# Patient Record
Sex: Male | Born: 1945 | Race: White | Hispanic: No | Marital: Married | Smoking: Never smoker
Health system: Southern US, Community
[De-identification: ages and names within clinical notes are randomized; demographics above are authoritative.]

## PROBLEM LIST (undated history)

## (undated) DIAGNOSIS — F0390 Unspecified dementia without behavioral disturbance: Secondary | ICD-10-CM

## (undated) DIAGNOSIS — I639 Cerebral infarction, unspecified: Secondary | ICD-10-CM

## (undated) DIAGNOSIS — I10 Essential (primary) hypertension: Secondary | ICD-10-CM

## (undated) DIAGNOSIS — G4730 Sleep apnea, unspecified: Secondary | ICD-10-CM

## (undated) DIAGNOSIS — R569 Unspecified convulsions: Secondary | ICD-10-CM

## (undated) HISTORY — PX: NOSE SURGERY: SHX723

---

## 2011-11-25 DIAGNOSIS — E785 Hyperlipidemia, unspecified: Secondary | ICD-10-CM | POA: Diagnosis not present

## 2011-11-25 DIAGNOSIS — Z79899 Other long term (current) drug therapy: Secondary | ICD-10-CM | POA: Diagnosis not present

## 2011-12-17 DIAGNOSIS — Z7901 Long term (current) use of anticoagulants: Secondary | ICD-10-CM | POA: Diagnosis not present

## 2011-12-17 DIAGNOSIS — I6789 Other cerebrovascular disease: Secondary | ICD-10-CM | POA: Diagnosis not present

## 2012-01-06 DIAGNOSIS — N200 Calculus of kidney: Secondary | ICD-10-CM | POA: Diagnosis not present

## 2012-01-06 DIAGNOSIS — N400 Enlarged prostate without lower urinary tract symptoms: Secondary | ICD-10-CM | POA: Diagnosis not present

## 2012-01-15 DIAGNOSIS — Z7901 Long term (current) use of anticoagulants: Secondary | ICD-10-CM | POA: Diagnosis not present

## 2012-01-15 DIAGNOSIS — I6789 Other cerebrovascular disease: Secondary | ICD-10-CM | POA: Diagnosis not present

## 2012-01-21 DIAGNOSIS — I10 Essential (primary) hypertension: Secondary | ICD-10-CM | POA: Diagnosis not present

## 2012-01-21 DIAGNOSIS — E785 Hyperlipidemia, unspecified: Secondary | ICD-10-CM | POA: Diagnosis not present

## 2012-03-31 DIAGNOSIS — Z7901 Long term (current) use of anticoagulants: Secondary | ICD-10-CM | POA: Diagnosis not present

## 2012-03-31 DIAGNOSIS — I6789 Other cerebrovascular disease: Secondary | ICD-10-CM | POA: Diagnosis not present

## 2012-04-28 DIAGNOSIS — E785 Hyperlipidemia, unspecified: Secondary | ICD-10-CM | POA: Diagnosis not present

## 2012-04-28 DIAGNOSIS — F09 Unspecified mental disorder due to known physiological condition: Secondary | ICD-10-CM | POA: Diagnosis not present

## 2012-04-28 DIAGNOSIS — D72819 Decreased white blood cell count, unspecified: Secondary | ICD-10-CM | POA: Diagnosis not present

## 2012-04-28 DIAGNOSIS — I10 Essential (primary) hypertension: Secondary | ICD-10-CM | POA: Diagnosis not present

## 2012-04-28 DIAGNOSIS — I6350 Cerebral infarction due to unspecified occlusion or stenosis of unspecified cerebral artery: Secondary | ICD-10-CM | POA: Diagnosis not present

## 2012-05-05 DIAGNOSIS — E785 Hyperlipidemia, unspecified: Secondary | ICD-10-CM | POA: Diagnosis not present

## 2012-05-05 DIAGNOSIS — I6990 Unspecified sequelae of unspecified cerebrovascular disease: Secondary | ICD-10-CM | POA: Diagnosis not present

## 2012-05-05 DIAGNOSIS — Z136 Encounter for screening for cardiovascular disorders: Secondary | ICD-10-CM | POA: Diagnosis not present

## 2012-05-05 DIAGNOSIS — I10 Essential (primary) hypertension: Secondary | ICD-10-CM | POA: Diagnosis not present

## 2012-05-05 DIAGNOSIS — R7301 Impaired fasting glucose: Secondary | ICD-10-CM | POA: Diagnosis not present

## 2012-05-05 DIAGNOSIS — Z0000 Encounter for general adult medical examination without abnormal findings: Secondary | ICD-10-CM | POA: Diagnosis not present

## 2012-05-26 DIAGNOSIS — I69919 Unspecified symptoms and signs involving cognitive functions following unspecified cerebrovascular disease: Secondary | ICD-10-CM | POA: Diagnosis not present

## 2012-05-27 DIAGNOSIS — I6350 Cerebral infarction due to unspecified occlusion or stenosis of unspecified cerebral artery: Secondary | ICD-10-CM | POA: Diagnosis not present

## 2012-05-27 DIAGNOSIS — Z7901 Long term (current) use of anticoagulants: Secondary | ICD-10-CM | POA: Diagnosis not present

## 2012-06-03 DIAGNOSIS — G4733 Obstructive sleep apnea (adult) (pediatric): Secondary | ICD-10-CM | POA: Diagnosis not present

## 2012-06-03 DIAGNOSIS — J387 Other diseases of larynx: Secondary | ICD-10-CM | POA: Diagnosis not present

## 2012-07-22 DIAGNOSIS — I6350 Cerebral infarction due to unspecified occlusion or stenosis of unspecified cerebral artery: Secondary | ICD-10-CM | POA: Diagnosis not present

## 2012-07-22 DIAGNOSIS — Z7901 Long term (current) use of anticoagulants: Secondary | ICD-10-CM | POA: Diagnosis not present

## 2012-07-28 DIAGNOSIS — F09 Unspecified mental disorder due to known physiological condition: Secondary | ICD-10-CM | POA: Diagnosis not present

## 2012-07-28 DIAGNOSIS — E559 Vitamin D deficiency, unspecified: Secondary | ICD-10-CM | POA: Diagnosis not present

## 2012-07-28 DIAGNOSIS — I10 Essential (primary) hypertension: Secondary | ICD-10-CM | POA: Diagnosis not present

## 2012-07-28 DIAGNOSIS — Z139 Encounter for screening, unspecified: Secondary | ICD-10-CM | POA: Diagnosis not present

## 2012-07-28 DIAGNOSIS — Z7901 Long term (current) use of anticoagulants: Secondary | ICD-10-CM | POA: Diagnosis not present

## 2012-07-28 DIAGNOSIS — I6350 Cerebral infarction due to unspecified occlusion or stenosis of unspecified cerebral artery: Secondary | ICD-10-CM | POA: Diagnosis not present

## 2012-07-28 DIAGNOSIS — Z79899 Other long term (current) drug therapy: Secondary | ICD-10-CM | POA: Diagnosis not present

## 2012-07-28 DIAGNOSIS — E538 Deficiency of other specified B group vitamins: Secondary | ICD-10-CM | POA: Diagnosis not present

## 2012-07-30 DIAGNOSIS — E785 Hyperlipidemia, unspecified: Secondary | ICD-10-CM | POA: Diagnosis not present

## 2012-07-30 DIAGNOSIS — I6990 Unspecified sequelae of unspecified cerebrovascular disease: Secondary | ICD-10-CM | POA: Diagnosis not present

## 2012-07-30 DIAGNOSIS — I10 Essential (primary) hypertension: Secondary | ICD-10-CM | POA: Diagnosis not present

## 2012-08-12 DIAGNOSIS — Z23 Encounter for immunization: Secondary | ICD-10-CM | POA: Diagnosis not present

## 2012-08-21 DIAGNOSIS — Z8673 Personal history of transient ischemic attack (TIA), and cerebral infarction without residual deficits: Secondary | ICD-10-CM | POA: Diagnosis not present

## 2012-08-21 DIAGNOSIS — I6350 Cerebral infarction due to unspecified occlusion or stenosis of unspecified cerebral artery: Secondary | ICD-10-CM | POA: Diagnosis not present

## 2012-08-21 DIAGNOSIS — F09 Unspecified mental disorder due to known physiological condition: Secondary | ICD-10-CM | POA: Diagnosis not present

## 2012-08-21 DIAGNOSIS — E237 Disorder of pituitary gland, unspecified: Secondary | ICD-10-CM | POA: Diagnosis not present

## 2012-08-23 DIAGNOSIS — S61209A Unspecified open wound of unspecified finger without damage to nail, initial encounter: Secondary | ICD-10-CM | POA: Diagnosis not present

## 2012-09-16 DIAGNOSIS — Z7901 Long term (current) use of anticoagulants: Secondary | ICD-10-CM | POA: Diagnosis not present

## 2012-09-16 DIAGNOSIS — I6350 Cerebral infarction due to unspecified occlusion or stenosis of unspecified cerebral artery: Secondary | ICD-10-CM | POA: Diagnosis not present

## 2012-10-06 DIAGNOSIS — H43819 Vitreous degeneration, unspecified eye: Secondary | ICD-10-CM | POA: Diagnosis not present

## 2012-10-06 DIAGNOSIS — D3130 Benign neoplasm of unspecified choroid: Secondary | ICD-10-CM | POA: Diagnosis not present

## 2012-10-06 DIAGNOSIS — H1045 Other chronic allergic conjunctivitis: Secondary | ICD-10-CM | POA: Diagnosis not present

## 2012-10-09 DIAGNOSIS — Z7901 Long term (current) use of anticoagulants: Secondary | ICD-10-CM | POA: Diagnosis not present

## 2012-10-09 DIAGNOSIS — I6350 Cerebral infarction due to unspecified occlusion or stenosis of unspecified cerebral artery: Secondary | ICD-10-CM | POA: Diagnosis not present

## 2012-11-13 DIAGNOSIS — R7301 Impaired fasting glucose: Secondary | ICD-10-CM | POA: Diagnosis not present

## 2012-11-13 DIAGNOSIS — E785 Hyperlipidemia, unspecified: Secondary | ICD-10-CM | POA: Diagnosis not present

## 2012-11-13 DIAGNOSIS — Z79899 Other long term (current) drug therapy: Secondary | ICD-10-CM | POA: Diagnosis not present

## 2012-11-18 DIAGNOSIS — Z7901 Long term (current) use of anticoagulants: Secondary | ICD-10-CM | POA: Diagnosis not present

## 2012-11-18 DIAGNOSIS — I6350 Cerebral infarction due to unspecified occlusion or stenosis of unspecified cerebral artery: Secondary | ICD-10-CM | POA: Diagnosis not present

## 2012-11-19 DIAGNOSIS — E785 Hyperlipidemia, unspecified: Secondary | ICD-10-CM | POA: Diagnosis not present

## 2012-11-19 DIAGNOSIS — I6990 Unspecified sequelae of unspecified cerebrovascular disease: Secondary | ICD-10-CM | POA: Diagnosis not present

## 2012-11-19 DIAGNOSIS — I10 Essential (primary) hypertension: Secondary | ICD-10-CM | POA: Diagnosis not present

## 2012-11-19 DIAGNOSIS — R7301 Impaired fasting glucose: Secondary | ICD-10-CM | POA: Diagnosis not present

## 2012-11-24 DIAGNOSIS — I10 Essential (primary) hypertension: Secondary | ICD-10-CM | POA: Diagnosis not present

## 2012-11-24 DIAGNOSIS — Q211 Atrial septal defect: Secondary | ICD-10-CM | POA: Diagnosis not present

## 2012-11-24 DIAGNOSIS — Z8673 Personal history of transient ischemic attack (TIA), and cerebral infarction without residual deficits: Secondary | ICD-10-CM | POA: Diagnosis not present

## 2012-11-24 DIAGNOSIS — I6350 Cerebral infarction due to unspecified occlusion or stenosis of unspecified cerebral artery: Secondary | ICD-10-CM | POA: Diagnosis not present

## 2012-11-24 DIAGNOSIS — Q2111 Secundum atrial septal defect: Secondary | ICD-10-CM | POA: Diagnosis not present

## 2012-11-24 DIAGNOSIS — F09 Unspecified mental disorder due to known physiological condition: Secondary | ICD-10-CM | POA: Diagnosis not present

## 2012-11-27 DIAGNOSIS — IMO0001 Reserved for inherently not codable concepts without codable children: Secondary | ICD-10-CM | POA: Diagnosis not present

## 2012-11-27 DIAGNOSIS — G3184 Mild cognitive impairment, so stated: Secondary | ICD-10-CM | POA: Diagnosis not present

## 2013-01-06 DIAGNOSIS — Z7901 Long term (current) use of anticoagulants: Secondary | ICD-10-CM | POA: Diagnosis not present

## 2013-01-06 DIAGNOSIS — I6350 Cerebral infarction due to unspecified occlusion or stenosis of unspecified cerebral artery: Secondary | ICD-10-CM | POA: Diagnosis not present

## 2013-01-07 DIAGNOSIS — N400 Enlarged prostate without lower urinary tract symptoms: Secondary | ICD-10-CM | POA: Diagnosis not present

## 2013-01-07 DIAGNOSIS — N200 Calculus of kidney: Secondary | ICD-10-CM | POA: Diagnosis not present

## 2013-01-07 DIAGNOSIS — R109 Unspecified abdominal pain: Secondary | ICD-10-CM | POA: Diagnosis not present

## 2013-01-07 DIAGNOSIS — R1084 Generalized abdominal pain: Secondary | ICD-10-CM | POA: Diagnosis not present

## 2013-02-16 DIAGNOSIS — R7301 Impaired fasting glucose: Secondary | ICD-10-CM | POA: Diagnosis not present

## 2013-02-17 DIAGNOSIS — I6350 Cerebral infarction due to unspecified occlusion or stenosis of unspecified cerebral artery: Secondary | ICD-10-CM | POA: Diagnosis not present

## 2013-02-17 DIAGNOSIS — Z7901 Long term (current) use of anticoagulants: Secondary | ICD-10-CM | POA: Diagnosis not present

## 2013-02-18 DIAGNOSIS — I10 Essential (primary) hypertension: Secondary | ICD-10-CM | POA: Diagnosis not present

## 2013-02-18 DIAGNOSIS — R7301 Impaired fasting glucose: Secondary | ICD-10-CM | POA: Diagnosis not present

## 2013-02-18 DIAGNOSIS — E785 Hyperlipidemia, unspecified: Secondary | ICD-10-CM | POA: Diagnosis not present

## 2013-03-31 DIAGNOSIS — I6350 Cerebral infarction due to unspecified occlusion or stenosis of unspecified cerebral artery: Secondary | ICD-10-CM | POA: Diagnosis not present

## 2013-03-31 DIAGNOSIS — Z7901 Long term (current) use of anticoagulants: Secondary | ICD-10-CM | POA: Diagnosis not present

## 2013-04-20 DIAGNOSIS — J387 Other diseases of larynx: Secondary | ICD-10-CM | POA: Diagnosis not present

## 2013-04-20 DIAGNOSIS — G4733 Obstructive sleep apnea (adult) (pediatric): Secondary | ICD-10-CM | POA: Diagnosis not present

## 2013-06-09 DIAGNOSIS — E785 Hyperlipidemia, unspecified: Secondary | ICD-10-CM | POA: Diagnosis not present

## 2013-06-09 DIAGNOSIS — R7301 Impaired fasting glucose: Secondary | ICD-10-CM | POA: Diagnosis not present

## 2013-06-09 DIAGNOSIS — I10 Essential (primary) hypertension: Secondary | ICD-10-CM | POA: Diagnosis not present

## 2013-06-09 DIAGNOSIS — Z125 Encounter for screening for malignant neoplasm of prostate: Secondary | ICD-10-CM | POA: Diagnosis not present

## 2013-06-09 DIAGNOSIS — Z79899 Other long term (current) drug therapy: Secondary | ICD-10-CM | POA: Diagnosis not present

## 2013-06-11 DIAGNOSIS — I6990 Unspecified sequelae of unspecified cerebrovascular disease: Secondary | ICD-10-CM | POA: Diagnosis not present

## 2013-06-11 DIAGNOSIS — Z136 Encounter for screening for cardiovascular disorders: Secondary | ICD-10-CM | POA: Diagnosis not present

## 2013-06-11 DIAGNOSIS — Z0000 Encounter for general adult medical examination without abnormal findings: Secondary | ICD-10-CM | POA: Diagnosis not present

## 2013-06-11 DIAGNOSIS — Z7901 Long term (current) use of anticoagulants: Secondary | ICD-10-CM | POA: Diagnosis not present

## 2013-06-11 DIAGNOSIS — I10 Essential (primary) hypertension: Secondary | ICD-10-CM | POA: Diagnosis not present

## 2013-06-11 DIAGNOSIS — E785 Hyperlipidemia, unspecified: Secondary | ICD-10-CM | POA: Diagnosis not present

## 2013-06-11 DIAGNOSIS — I6350 Cerebral infarction due to unspecified occlusion or stenosis of unspecified cerebral artery: Secondary | ICD-10-CM | POA: Diagnosis not present

## 2013-06-30 DIAGNOSIS — M722 Plantar fascial fibromatosis: Secondary | ICD-10-CM | POA: Diagnosis not present

## 2013-07-09 DIAGNOSIS — Z7901 Long term (current) use of anticoagulants: Secondary | ICD-10-CM | POA: Diagnosis not present

## 2013-07-09 DIAGNOSIS — I6350 Cerebral infarction due to unspecified occlusion or stenosis of unspecified cerebral artery: Secondary | ICD-10-CM | POA: Diagnosis not present

## 2013-07-09 DIAGNOSIS — M722 Plantar fascial fibromatosis: Secondary | ICD-10-CM | POA: Diagnosis not present

## 2013-08-23 DIAGNOSIS — I6350 Cerebral infarction due to unspecified occlusion or stenosis of unspecified cerebral artery: Secondary | ICD-10-CM | POA: Diagnosis not present

## 2013-08-23 DIAGNOSIS — Z7901 Long term (current) use of anticoagulants: Secondary | ICD-10-CM | POA: Diagnosis not present

## 2013-08-27 DIAGNOSIS — R972 Elevated prostate specific antigen [PSA]: Secondary | ICD-10-CM | POA: Diagnosis not present

## 2013-09-02 DIAGNOSIS — Z23 Encounter for immunization: Secondary | ICD-10-CM | POA: Diagnosis not present

## 2013-09-06 DIAGNOSIS — N200 Calculus of kidney: Secondary | ICD-10-CM | POA: Diagnosis not present

## 2013-09-06 DIAGNOSIS — N400 Enlarged prostate without lower urinary tract symptoms: Secondary | ICD-10-CM | POA: Diagnosis not present

## 2013-09-09 DIAGNOSIS — Z7901 Long term (current) use of anticoagulants: Secondary | ICD-10-CM | POA: Diagnosis not present

## 2013-09-09 DIAGNOSIS — Z79899 Other long term (current) drug therapy: Secondary | ICD-10-CM | POA: Diagnosis not present

## 2013-09-09 DIAGNOSIS — I10 Essential (primary) hypertension: Secondary | ICD-10-CM | POA: Diagnosis not present

## 2013-09-09 DIAGNOSIS — Z8673 Personal history of transient ischemic attack (TIA), and cerebral infarction without residual deficits: Secondary | ICD-10-CM | POA: Diagnosis not present

## 2013-09-09 DIAGNOSIS — G3184 Mild cognitive impairment, so stated: Secondary | ICD-10-CM | POA: Diagnosis not present

## 2013-09-09 DIAGNOSIS — E785 Hyperlipidemia, unspecified: Secondary | ICD-10-CM | POA: Diagnosis not present

## 2013-09-09 DIAGNOSIS — I69919 Unspecified symptoms and signs involving cognitive functions following unspecified cerebrovascular disease: Secondary | ICD-10-CM | POA: Diagnosis not present

## 2013-09-09 DIAGNOSIS — Q211 Atrial septal defect: Secondary | ICD-10-CM | POA: Diagnosis not present

## 2013-09-21 DIAGNOSIS — Z7901 Long term (current) use of anticoagulants: Secondary | ICD-10-CM | POA: Diagnosis not present

## 2013-09-21 DIAGNOSIS — I6350 Cerebral infarction due to unspecified occlusion or stenosis of unspecified cerebral artery: Secondary | ICD-10-CM | POA: Diagnosis not present

## 2013-10-06 DIAGNOSIS — R319 Hematuria, unspecified: Secondary | ICD-10-CM | POA: Diagnosis not present

## 2013-10-08 DIAGNOSIS — I498 Other specified cardiac arrhythmias: Secondary | ICD-10-CM | POA: Diagnosis not present

## 2013-10-08 DIAGNOSIS — Z8673 Personal history of transient ischemic attack (TIA), and cerebral infarction without residual deficits: Secondary | ICD-10-CM | POA: Diagnosis not present

## 2013-10-11 DIAGNOSIS — R319 Hematuria, unspecified: Secondary | ICD-10-CM | POA: Diagnosis not present

## 2013-10-27 DIAGNOSIS — H1045 Other chronic allergic conjunctivitis: Secondary | ICD-10-CM | POA: Diagnosis not present

## 2013-10-27 DIAGNOSIS — Z5181 Encounter for therapeutic drug level monitoring: Secondary | ICD-10-CM | POA: Diagnosis not present

## 2013-10-27 DIAGNOSIS — H43819 Vitreous degeneration, unspecified eye: Secondary | ICD-10-CM | POA: Diagnosis not present

## 2013-10-27 DIAGNOSIS — D3130 Benign neoplasm of unspecified choroid: Secondary | ICD-10-CM | POA: Diagnosis not present

## 2014-02-28 DIAGNOSIS — I10 Essential (primary) hypertension: Secondary | ICD-10-CM | POA: Diagnosis not present

## 2014-02-28 DIAGNOSIS — K2100 Gastro-esophageal reflux disease with esophagitis, without bleeding: Secondary | ICD-10-CM | POA: Diagnosis not present

## 2014-02-28 DIAGNOSIS — E785 Hyperlipidemia, unspecified: Secondary | ICD-10-CM | POA: Diagnosis not present

## 2014-02-28 DIAGNOSIS — I6990 Unspecified sequelae of unspecified cerebrovascular disease: Secondary | ICD-10-CM | POA: Diagnosis not present

## 2014-03-18 DIAGNOSIS — N401 Enlarged prostate with lower urinary tract symptoms: Secondary | ICD-10-CM | POA: Diagnosis not present

## 2014-03-18 DIAGNOSIS — N200 Calculus of kidney: Secondary | ICD-10-CM | POA: Diagnosis not present

## 2014-03-18 DIAGNOSIS — N139 Obstructive and reflux uropathy, unspecified: Secondary | ICD-10-CM | POA: Diagnosis not present

## 2014-03-25 DIAGNOSIS — E785 Hyperlipidemia, unspecified: Secondary | ICD-10-CM | POA: Diagnosis not present

## 2014-03-25 DIAGNOSIS — K219 Gastro-esophageal reflux disease without esophagitis: Secondary | ICD-10-CM | POA: Diagnosis not present

## 2014-03-25 DIAGNOSIS — I6340 Cerebral infarction due to embolism of unspecified cerebral artery: Secondary | ICD-10-CM | POA: Diagnosis not present

## 2014-03-25 DIAGNOSIS — I10 Essential (primary) hypertension: Secondary | ICD-10-CM | POA: Diagnosis not present

## 2014-04-21 ENCOUNTER — Ambulatory Visit: Payer: Medicare Other | Admitting: Neurology

## 2014-05-03 DIAGNOSIS — I69919 Unspecified symptoms and signs involving cognitive functions following unspecified cerebrovascular disease: Secondary | ICD-10-CM | POA: Diagnosis not present

## 2014-05-03 DIAGNOSIS — I10 Essential (primary) hypertension: Secondary | ICD-10-CM | POA: Diagnosis not present

## 2014-05-03 DIAGNOSIS — R259 Unspecified abnormal involuntary movements: Secondary | ICD-10-CM | POA: Diagnosis not present

## 2014-05-03 DIAGNOSIS — G9349 Other encephalopathy: Secondary | ICD-10-CM | POA: Diagnosis not present

## 2014-05-03 DIAGNOSIS — R413 Other amnesia: Secondary | ICD-10-CM | POA: Diagnosis not present

## 2014-05-03 DIAGNOSIS — F29 Unspecified psychosis not due to a substance or known physiological condition: Secondary | ICD-10-CM | POA: Diagnosis not present

## 2014-05-03 DIAGNOSIS — G3184 Mild cognitive impairment, so stated: Secondary | ICD-10-CM | POA: Diagnosis not present

## 2014-05-03 DIAGNOSIS — H5340 Unspecified visual field defects: Secondary | ICD-10-CM | POA: Diagnosis not present

## 2014-05-03 DIAGNOSIS — Z885 Allergy status to narcotic agent status: Secondary | ICD-10-CM | POA: Diagnosis not present

## 2014-05-03 DIAGNOSIS — Z8673 Personal history of transient ischemic attack (TIA), and cerebral infarction without residual deficits: Secondary | ICD-10-CM | POA: Diagnosis not present

## 2014-05-18 DIAGNOSIS — R350 Frequency of micturition: Secondary | ICD-10-CM | POA: Diagnosis not present

## 2014-06-07 DIAGNOSIS — R413 Other amnesia: Secondary | ICD-10-CM | POA: Diagnosis not present

## 2014-06-07 DIAGNOSIS — E063 Autoimmune thyroiditis: Secondary | ICD-10-CM | POA: Diagnosis not present

## 2014-06-07 DIAGNOSIS — G9349 Other encephalopathy: Secondary | ICD-10-CM | POA: Diagnosis not present

## 2014-06-07 DIAGNOSIS — I69919 Unspecified symptoms and signs involving cognitive functions following unspecified cerebrovascular disease: Secondary | ICD-10-CM | POA: Diagnosis not present

## 2014-07-13 DIAGNOSIS — J309 Allergic rhinitis, unspecified: Secondary | ICD-10-CM | POA: Diagnosis not present

## 2014-08-25 DIAGNOSIS — Z23 Encounter for immunization: Secondary | ICD-10-CM | POA: Diagnosis not present

## 2014-08-25 DIAGNOSIS — I10 Essential (primary) hypertension: Secondary | ICD-10-CM | POA: Diagnosis not present

## 2014-08-25 DIAGNOSIS — Z125 Encounter for screening for malignant neoplasm of prostate: Secondary | ICD-10-CM | POA: Diagnosis not present

## 2014-08-25 DIAGNOSIS — Z0000 Encounter for general adult medical examination without abnormal findings: Secondary | ICD-10-CM | POA: Diagnosis not present

## 2014-08-30 DIAGNOSIS — H5340 Unspecified visual field defects: Secondary | ICD-10-CM | POA: Diagnosis not present

## 2014-08-30 DIAGNOSIS — R4701 Aphasia: Secondary | ICD-10-CM | POA: Diagnosis not present

## 2014-08-30 DIAGNOSIS — F0391 Unspecified dementia with behavioral disturbance: Secondary | ICD-10-CM | POA: Diagnosis not present

## 2014-08-30 DIAGNOSIS — R413 Other amnesia: Secondary | ICD-10-CM | POA: Diagnosis not present

## 2014-08-30 DIAGNOSIS — I6931 Cognitive deficits following cerebral infarction: Secondary | ICD-10-CM | POA: Diagnosis not present

## 2014-09-01 DIAGNOSIS — K219 Gastro-esophageal reflux disease without esophagitis: Secondary | ICD-10-CM | POA: Diagnosis not present

## 2014-09-01 DIAGNOSIS — I6340 Cerebral infarction due to embolism of unspecified cerebral artery: Secondary | ICD-10-CM | POA: Diagnosis not present

## 2014-09-01 DIAGNOSIS — I10 Essential (primary) hypertension: Secondary | ICD-10-CM | POA: Diagnosis not present

## 2014-09-01 DIAGNOSIS — E785 Hyperlipidemia, unspecified: Secondary | ICD-10-CM | POA: Diagnosis not present

## 2014-09-13 DIAGNOSIS — L719 Rosacea, unspecified: Secondary | ICD-10-CM | POA: Diagnosis not present

## 2014-09-13 DIAGNOSIS — B079 Viral wart, unspecified: Secondary | ICD-10-CM | POA: Diagnosis not present

## 2014-09-27 DIAGNOSIS — G9389 Other specified disorders of brain: Secondary | ICD-10-CM | POA: Diagnosis not present

## 2014-09-27 DIAGNOSIS — J341 Cyst and mucocele of nose and nasal sinus: Secondary | ICD-10-CM | POA: Diagnosis not present

## 2014-09-27 DIAGNOSIS — R413 Other amnesia: Secondary | ICD-10-CM | POA: Diagnosis not present

## 2014-10-12 ENCOUNTER — Emergency Department (HOSPITAL_COMMUNITY)
Admission: EM | Admit: 2014-10-12 | Discharge: 2014-10-13 | Disposition: A | Discharge status: Home / Self Care | Payer: Medicare Other | Attending: Emergency Medicine | Admitting: Emergency Medicine

## 2014-10-12 ENCOUNTER — Encounter (HOSPITAL_COMMUNITY): Payer: Self-pay | Admitting: Emergency Medicine

## 2014-10-12 DIAGNOSIS — R410 Disorientation, unspecified: Secondary | ICD-10-CM | POA: Diagnosis not present

## 2014-10-12 DIAGNOSIS — Z8669 Personal history of other diseases of the nervous system and sense organs: Secondary | ICD-10-CM | POA: Diagnosis not present

## 2014-10-12 DIAGNOSIS — R4182 Altered mental status, unspecified: Secondary | ICD-10-CM | POA: Diagnosis not present

## 2014-10-12 DIAGNOSIS — F0391 Unspecified dementia with behavioral disturbance: Secondary | ICD-10-CM | POA: Insufficient documentation

## 2014-10-12 DIAGNOSIS — I10 Essential (primary) hypertension: Secondary | ICD-10-CM | POA: Insufficient documentation

## 2014-10-12 DIAGNOSIS — R40241 Glasgow coma scale score 13-15: Secondary | ICD-10-CM | POA: Diagnosis not present

## 2014-10-12 DIAGNOSIS — I69920 Aphasia following unspecified cerebrovascular disease: Secondary | ICD-10-CM | POA: Diagnosis not present

## 2014-10-12 HISTORY — DX: Cerebral infarction, unspecified: I63.9

## 2014-10-12 HISTORY — DX: Essential (primary) hypertension: I10

## 2014-10-12 HISTORY — DX: Sleep apnea, unspecified: G47.30

## 2014-10-12 HISTORY — DX: Unspecified dementia, unspecified severity, without behavioral disturbance, psychotic disturbance, mood disturbance, and anxiety: F03.90

## 2014-10-12 NOTE — ED Notes (Addendum)
Pt. arrived with EMS from home , family reported progressing intermittent confusion and disorientation for the past several days and again  today at church  , CBG = 126 by EMS at arrival , alert and oriented at arrival  / respirations unlabored. Speech clear with no facial asymmetry , equal grips / no arm drift or leg drift .

## 2014-10-13 ENCOUNTER — Emergency Department (HOSPITAL_COMMUNITY): Payer: Medicare Other

## 2014-10-13 ENCOUNTER — Encounter (HOSPITAL_COMMUNITY): Payer: Self-pay

## 2014-10-13 DIAGNOSIS — R410 Disorientation, unspecified: Secondary | ICD-10-CM | POA: Diagnosis not present

## 2014-10-13 DIAGNOSIS — F0391 Unspecified dementia with behavioral disturbance: Secondary | ICD-10-CM | POA: Diagnosis not present

## 2014-10-13 LAB — COMPREHENSIVE METABOLIC PANEL
ALK PHOS: 57 U/L (ref 39–117)
ALT: 16 U/L (ref 0–53)
AST: 22 U/L (ref 0–37)
Albumin: 3.4 g/dL — ABNORMAL LOW (ref 3.5–5.2)
Anion gap: 11 (ref 5–15)
BILIRUBIN TOTAL: 1.1 mg/dL (ref 0.3–1.2)
BUN: 14 mg/dL (ref 6–23)
CHLORIDE: 101 meq/L (ref 96–112)
CO2: 28 mEq/L (ref 19–32)
Calcium: 9.4 mg/dL (ref 8.4–10.5)
Creatinine, Ser: 1.02 mg/dL (ref 0.50–1.35)
GFR, EST AFRICAN AMERICAN: 85 mL/min — AB (ref >90)
GFR, EST NON AFRICAN AMERICAN: 73 mL/min — AB (ref >90)
GLUCOSE: 125 mg/dL — AB (ref 70–99)
POTASSIUM: 4.4 meq/L (ref 3.7–5.3)
SODIUM: 140 meq/L (ref 137–147)
Total Protein: 6.2 g/dL (ref 6.0–8.3)

## 2014-10-13 LAB — CBC WITH DIFFERENTIAL/PLATELET
Basophils Absolute: 0 10*3/uL (ref 0.0–0.1)
Basophils Relative: 0 % (ref 0–1)
Eosinophils Absolute: 0.2 10*3/uL (ref 0.0–0.7)
Eosinophils Relative: 4 % (ref 0–5)
HCT: 42.8 % (ref 39.0–52.0)
Hemoglobin: 14.3 g/dL (ref 13.0–17.0)
LYMPHS ABS: 1.3 10*3/uL (ref 0.7–4.0)
LYMPHS PCT: 29 % (ref 12–46)
MCH: 30.1 pg (ref 26.0–34.0)
MCHC: 33.4 g/dL (ref 30.0–36.0)
MCV: 90.1 fL (ref 78.0–100.0)
Monocytes Absolute: 0.6 10*3/uL (ref 0.1–1.0)
Monocytes Relative: 13 % — ABNORMAL HIGH (ref 3–12)
NEUTROS PCT: 54 % (ref 43–77)
Neutro Abs: 2.4 10*3/uL (ref 1.7–7.7)
PLATELETS: 196 10*3/uL (ref 150–400)
RBC: 4.75 MIL/uL (ref 4.22–5.81)
RDW: 12 % (ref 11.5–15.5)
WBC: 4.5 10*3/uL (ref 4.0–10.5)

## 2014-10-13 LAB — URINALYSIS, ROUTINE W REFLEX MICROSCOPIC
BILIRUBIN URINE: NEGATIVE
Glucose, UA: NEGATIVE mg/dL
HGB URINE DIPSTICK: NEGATIVE
KETONES UR: 15 mg/dL — AB
Leukocytes, UA: NEGATIVE
Nitrite: NEGATIVE
PROTEIN: NEGATIVE mg/dL
Specific Gravity, Urine: 1.025 (ref 1.005–1.030)
UROBILINOGEN UA: 0.2 mg/dL (ref 0.0–1.0)
pH: 5.5 (ref 5.0–8.0)

## 2014-10-13 LAB — ETHANOL: Alcohol, Ethyl (B): <11 mg/dL (ref 0–11)

## 2014-10-13 MED ORDER — CLOPIDOGREL BISULFATE 75 MG PO TABS: 75.0000 mg | ORAL_TABLET | Freq: Every day | ORAL | Status: DC

## 2014-10-13 MED ORDER — CLOPIDOGREL BISULFATE 75 MG PO TABS
75.0000 mg | ORAL_TABLET | Freq: Once | ORAL | Status: AC
  Administered 2014-10-13: 75 mg via ORAL
  Filled 2014-10-13: qty 1

## 2014-10-13 MED ORDER — SODIUM CHLORIDE 0.9 % IV BOLUS (SEPSIS)
500.0000 mL | Freq: Once | INTRAVENOUS | Status: AC
  Administered 2014-10-13: 500 mL via INTRAVENOUS

## 2014-10-13 NOTE — ED Provider Notes (Signed)
CSN: [id number]     Arrival date & time 10/12/14  2333 History   First MD Initiated Contact with Patient 10/12/14 2355     Chief Complaint  Patient presents with  . Altered Mental Status     (Consider location/radiation/quality/duration/timing/severity/associated sxs/prior Treatment) Patient is a 68 y.o. male presenting with altered mental status. The history is provided by the patient. No language interpreter was used.  Altered Mental Status Presenting symptoms: disorientation   Severity:  Moderate Most recent episode:  Today Associated symptoms comment:  History is provided by the son at bedside. The patient is unable to relay reliable history secondary to decreased focus and concentration, memory loss, difficulty speaking. Per his son, he has had progressive memory loss over the last several months. 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 He has a history of stroke (2005) with symptoms of aphasia, visual loss and disorientation with minimal symptoms of weakness.    Past Medical History  Diagnosis Date  . Hypertension   . Stroke   . Dementia   . Sleep apnea    Past Surgical History  Procedure Laterality Date  . Nose surgery     No family history on file. History  Substance Use Topics  . Smoking status: Never Smoker   . Smokeless tobacco: Not on file  . Alcohol Use: No    Review of Systems  Unable to perform ROS     Allergies  Review of patient's allergies indicates not on file.  Home Medications   Prior to Admission medications   Not on File   BP 104/65 mmHg  Pulse 68  Temp(Src) 97.9 F (36.6 C) (Oral)  Resp 14  Ht 5' 11.5" (1.816 m)  Wt 185 lb (83.915 kg)  BMI 25.45 kg/m2  SpO2 97% Physical Exam  Constitutional: He appears  well-developed and well-nourished.  HENT:  Head: Normocephalic.  Neck: Normal range of motion. Neck supple.  Cardiovascular: Normal rate and regular rhythm.   Pulmonary/Chest: Effort normal and breath sounds normal. He has no wheezes. He has no rales.  Abdominal: Soft. Bowel sounds are normal. There is no tenderness. There is no rebound and no guarding.  Musculoskeletal: Normal range of motion.  Neurological: He is alert. He has normal strength. No cranial nerve deficit or sensory deficit. Coordination normal. GCS eye subscore is 4. GCS verbal subscore is 5. GCS motor subscore is 6.  Reflex Scores:      Patellar reflexes are 1+ on the right side and 1+ on the left side. He is having difficulty saying what he would like to say, and will go off topic easily.   Skin: Skin is warm and dry. No rash noted.  Psychiatric: He has a normal mood and affect.    ED Course  Procedures (including critical care time) Labs Review Labs Reviewed  CBC WITH DIFFERENTIAL  COMPREHENSIVE METABOLIC PANEL    Imaging Review No results found.   EKG Interpretation None      MDM   Final diagnoses:  None    1. Altered mental status  Patient care assumed by Dr. Venora Maples, pending work up.     Dewaine Oats, PA-C 10/13/14 [place], MD 10/13/14 [phone]

## 2014-10-13 NOTE — ED Notes (Signed)
Paged neuro/[facility]

## 2014-10-13 NOTE — Discharge Instructions (Signed)

## 2014-10-17 DIAGNOSIS — L719 Rosacea, unspecified: Secondary | ICD-10-CM | POA: Diagnosis not present

## 2014-10-20 DIAGNOSIS — F0280 Dementia in other diseases classified elsewhere without behavioral disturbance: Secondary | ICD-10-CM | POA: Diagnosis not present

## 2014-10-20 DIAGNOSIS — G3101 Pick's disease: Secondary | ICD-10-CM | POA: Diagnosis not present

## 2014-10-20 DIAGNOSIS — F064 Anxiety disorder due to known physiological condition: Secondary | ICD-10-CM | POA: Diagnosis not present

## 2014-10-20 DIAGNOSIS — G3109 Other frontotemporal dementia: Secondary | ICD-10-CM | POA: Diagnosis not present

## 2014-10-21 DIAGNOSIS — I69320 Aphasia following cerebral infarction: Secondary | ICD-10-CM | POA: Diagnosis not present

## 2014-10-21 DIAGNOSIS — G3189 Other specified degenerative diseases of nervous system: Secondary | ICD-10-CM | POA: Diagnosis not present

## 2014-10-28 DIAGNOSIS — D3131 Benign neoplasm of right choroid: Secondary | ICD-10-CM | POA: Diagnosis not present

## 2014-12-05 DIAGNOSIS — L718 Other rosacea: Secondary | ICD-10-CM | POA: Diagnosis not present

## 2014-12-22 DIAGNOSIS — I69398 Other sequelae of cerebral infarction: Secondary | ICD-10-CM | POA: Diagnosis not present

## 2014-12-22 DIAGNOSIS — G3109 Other frontotemporal dementia: Secondary | ICD-10-CM | POA: Diagnosis not present

## 2014-12-22 DIAGNOSIS — R4701 Aphasia: Secondary | ICD-10-CM | POA: Diagnosis not present

## 2015-02-20 DIAGNOSIS — I10 Essential (primary) hypertension: Secondary | ICD-10-CM | POA: Diagnosis not present

## 2015-02-20 DIAGNOSIS — E785 Hyperlipidemia, unspecified: Secondary | ICD-10-CM | POA: Diagnosis not present

## 2015-03-10 DIAGNOSIS — E785 Hyperlipidemia, unspecified: Secondary | ICD-10-CM | POA: Diagnosis not present

## 2015-03-10 DIAGNOSIS — I10 Essential (primary) hypertension: Secondary | ICD-10-CM | POA: Diagnosis not present

## 2015-03-10 DIAGNOSIS — I6340 Cerebral infarction due to embolism of unspecified cerebral artery: Secondary | ICD-10-CM | POA: Diagnosis not present

## 2015-03-10 DIAGNOSIS — K219 Gastro-esophageal reflux disease without esophagitis: Secondary | ICD-10-CM | POA: Diagnosis not present

## 2015-03-30 DIAGNOSIS — G3101 Pick's disease: Secondary | ICD-10-CM | POA: Diagnosis not present

## 2015-03-30 DIAGNOSIS — I69398 Other sequelae of cerebral infarction: Secondary | ICD-10-CM | POA: Diagnosis not present

## 2015-03-30 DIAGNOSIS — G3109 Other frontotemporal dementia: Secondary | ICD-10-CM | POA: Diagnosis not present

## 2015-03-30 DIAGNOSIS — F064 Anxiety disorder due to known physiological condition: Secondary | ICD-10-CM | POA: Diagnosis not present

## 2015-03-30 DIAGNOSIS — F0280 Dementia in other diseases classified elsewhere without behavioral disturbance: Secondary | ICD-10-CM | POA: Diagnosis not present

## 2015-05-01 DIAGNOSIS — F4323 Adjustment disorder with mixed anxiety and depressed mood: Secondary | ICD-10-CM | POA: Diagnosis not present

## 2015-05-17 DIAGNOSIS — F329 Major depressive disorder, single episode, unspecified: Secondary | ICD-10-CM | POA: Diagnosis not present

## 2015-05-17 DIAGNOSIS — R4701 Aphasia: Secondary | ICD-10-CM | POA: Diagnosis not present

## 2015-05-17 DIAGNOSIS — F419 Anxiety disorder, unspecified: Secondary | ICD-10-CM | POA: Diagnosis not present

## 2015-05-17 DIAGNOSIS — G3109 Other frontotemporal dementia: Secondary | ICD-10-CM | POA: Diagnosis not present

## 2015-05-17 DIAGNOSIS — F064 Anxiety disorder due to known physiological condition: Secondary | ICD-10-CM | POA: Diagnosis not present

## 2015-08-03 DIAGNOSIS — G4709 Other insomnia: Secondary | ICD-10-CM | POA: Diagnosis not present

## 2015-08-03 DIAGNOSIS — I69398 Other sequelae of cerebral infarction: Secondary | ICD-10-CM | POA: Diagnosis not present

## 2015-08-03 DIAGNOSIS — F064 Anxiety disorder due to known physiological condition: Secondary | ICD-10-CM | POA: Diagnosis not present

## 2015-08-03 DIAGNOSIS — F0280 Dementia in other diseases classified elsewhere without behavioral disturbance: Secondary | ICD-10-CM | POA: Diagnosis not present

## 2015-08-03 DIAGNOSIS — G3109 Other frontotemporal dementia: Secondary | ICD-10-CM | POA: Diagnosis not present

## 2015-08-03 DIAGNOSIS — G3101 Pick's disease: Secondary | ICD-10-CM | POA: Diagnosis not present

## 2015-09-01 DIAGNOSIS — Z0000 Encounter for general adult medical examination without abnormal findings: Secondary | ICD-10-CM | POA: Diagnosis not present

## 2015-09-01 DIAGNOSIS — E785 Hyperlipidemia, unspecified: Secondary | ICD-10-CM | POA: Diagnosis not present

## 2015-09-01 DIAGNOSIS — Z13820 Encounter for screening for osteoporosis: Secondary | ICD-10-CM | POA: Diagnosis not present

## 2015-09-01 DIAGNOSIS — Z1389 Encounter for screening for other disorder: Secondary | ICD-10-CM | POA: Diagnosis not present

## 2015-09-01 DIAGNOSIS — Z23 Encounter for immunization: Secondary | ICD-10-CM | POA: Diagnosis not present

## 2015-09-01 DIAGNOSIS — R7309 Other abnormal glucose: Secondary | ICD-10-CM | POA: Diagnosis not present

## 2015-09-01 DIAGNOSIS — Z125 Encounter for screening for malignant neoplasm of prostate: Secondary | ICD-10-CM | POA: Diagnosis not present

## 2015-09-01 DIAGNOSIS — I10 Essential (primary) hypertension: Secondary | ICD-10-CM | POA: Diagnosis not present

## 2015-09-15 DIAGNOSIS — Z23 Encounter for immunization: Secondary | ICD-10-CM | POA: Diagnosis not present

## 2015-09-15 DIAGNOSIS — R7309 Other abnormal glucose: Secondary | ICD-10-CM | POA: Diagnosis not present

## 2015-09-15 DIAGNOSIS — E785 Hyperlipidemia, unspecified: Secondary | ICD-10-CM | POA: Diagnosis not present

## 2015-09-15 DIAGNOSIS — N182 Chronic kidney disease, stage 2 (mild): Secondary | ICD-10-CM | POA: Diagnosis not present

## 2015-09-15 DIAGNOSIS — K219 Gastro-esophageal reflux disease without esophagitis: Secondary | ICD-10-CM | POA: Diagnosis not present

## 2015-09-15 DIAGNOSIS — I129 Hypertensive chronic kidney disease with stage 1 through stage 4 chronic kidney disease, or unspecified chronic kidney disease: Secondary | ICD-10-CM | POA: Diagnosis not present

## 2015-10-16 DIAGNOSIS — N138 Other obstructive and reflux uropathy: Secondary | ICD-10-CM | POA: Diagnosis not present

## 2015-10-16 DIAGNOSIS — N200 Calculus of kidney: Secondary | ICD-10-CM | POA: Diagnosis not present

## 2015-10-16 DIAGNOSIS — N401 Enlarged prostate with lower urinary tract symptoms: Secondary | ICD-10-CM | POA: Diagnosis not present

## 2015-10-16 DIAGNOSIS — N3942 Incontinence without sensory awareness: Secondary | ICD-10-CM | POA: Diagnosis not present

## 2015-10-19 DIAGNOSIS — G3109 Other frontotemporal dementia: Secondary | ICD-10-CM | POA: Diagnosis not present

## 2015-10-19 DIAGNOSIS — G3101 Pick's disease: Secondary | ICD-10-CM | POA: Diagnosis not present

## 2015-10-19 DIAGNOSIS — Z7189 Other specified counseling: Secondary | ICD-10-CM | POA: Diagnosis not present

## 2015-10-19 DIAGNOSIS — G4709 Other insomnia: Secondary | ICD-10-CM | POA: Diagnosis not present

## 2015-10-19 DIAGNOSIS — F064 Anxiety disorder due to known physiological condition: Secondary | ICD-10-CM | POA: Diagnosis not present

## 2015-10-19 DIAGNOSIS — F0280 Dementia in other diseases classified elsewhere without behavioral disturbance: Secondary | ICD-10-CM | POA: Diagnosis not present

## 2015-10-19 DIAGNOSIS — R413 Other amnesia: Secondary | ICD-10-CM | POA: Diagnosis not present

## 2015-11-24 DIAGNOSIS — G3109 Other frontotemporal dementia: Secondary | ICD-10-CM | POA: Diagnosis not present

## 2015-11-24 DIAGNOSIS — N3942 Incontinence without sensory awareness: Secondary | ICD-10-CM | POA: Diagnosis not present

## 2015-11-24 DIAGNOSIS — R972 Elevated prostate specific antigen [PSA]: Secondary | ICD-10-CM | POA: Diagnosis not present

## 2015-11-24 DIAGNOSIS — Z0000 Encounter for general adult medical examination without abnormal findings: Secondary | ICD-10-CM | POA: Diagnosis not present

## 2015-12-05 DIAGNOSIS — Z23 Encounter for immunization: Secondary | ICD-10-CM | POA: Diagnosis not present

## 2016-02-07 DIAGNOSIS — E7800 Pure hypercholesterolemia, unspecified: Secondary | ICD-10-CM | POA: Diagnosis not present

## 2016-02-07 DIAGNOSIS — L719 Rosacea, unspecified: Secondary | ICD-10-CM | POA: Diagnosis not present

## 2016-02-07 DIAGNOSIS — G4700 Insomnia, unspecified: Secondary | ICD-10-CM | POA: Diagnosis not present

## 2016-02-07 DIAGNOSIS — I10 Essential (primary) hypertension: Secondary | ICD-10-CM | POA: Diagnosis not present

## 2016-02-07 DIAGNOSIS — G3109 Other frontotemporal dementia: Secondary | ICD-10-CM | POA: Diagnosis not present

## 2016-02-07 DIAGNOSIS — F325 Major depressive disorder, single episode, in full remission: Secondary | ICD-10-CM | POA: Diagnosis not present

## 2016-02-20 DIAGNOSIS — F0151 Vascular dementia with behavioral disturbance: Secondary | ICD-10-CM | POA: Diagnosis not present

## 2016-02-20 DIAGNOSIS — G3109 Other frontotemporal dementia: Secondary | ICD-10-CM | POA: Diagnosis not present

## 2016-02-20 DIAGNOSIS — R4701 Aphasia: Secondary | ICD-10-CM | POA: Diagnosis not present

## 2016-02-20 DIAGNOSIS — G309 Alzheimer's disease, unspecified: Secondary | ICD-10-CM | POA: Diagnosis not present

## 2016-02-20 DIAGNOSIS — I69398 Other sequelae of cerebral infarction: Secondary | ICD-10-CM | POA: Diagnosis not present

## 2016-02-20 DIAGNOSIS — G3101 Pick's disease: Secondary | ICD-10-CM | POA: Diagnosis not present

## 2016-02-20 DIAGNOSIS — F064 Anxiety disorder due to known physiological condition: Secondary | ICD-10-CM | POA: Diagnosis not present

## 2016-02-20 DIAGNOSIS — H539 Unspecified visual disturbance: Secondary | ICD-10-CM | POA: Diagnosis not present

## 2016-02-20 DIAGNOSIS — F0280 Dementia in other diseases classified elsewhere without behavioral disturbance: Secondary | ICD-10-CM | POA: Diagnosis not present

## 2016-03-19 DIAGNOSIS — M25473 Effusion, unspecified ankle: Secondary | ICD-10-CM | POA: Diagnosis not present

## 2016-03-19 DIAGNOSIS — I10 Essential (primary) hypertension: Secondary | ICD-10-CM | POA: Diagnosis not present

## 2016-03-19 DIAGNOSIS — R635 Abnormal weight gain: Secondary | ICD-10-CM | POA: Diagnosis not present

## 2016-05-15 DIAGNOSIS — R0781 Pleurodynia: Secondary | ICD-10-CM | POA: Diagnosis not present

## 2016-06-12 ENCOUNTER — Encounter (HOSPITAL_COMMUNITY): Payer: Self-pay | Admitting: Emergency Medicine

## 2016-06-12 ENCOUNTER — Other Ambulatory Visit: Payer: Self-pay

## 2016-06-12 ENCOUNTER — Emergency Department (HOSPITAL_COMMUNITY)
Admission: EM | Admit: 2016-06-12 | Discharge: 2016-06-12 | Disposition: A | Discharge status: Home / Self Care | Payer: Medicare Other | Attending: Emergency Medicine | Admitting: Emergency Medicine

## 2016-06-12 ENCOUNTER — Emergency Department (HOSPITAL_COMMUNITY): Payer: Medicare Other

## 2016-06-12 DIAGNOSIS — Z8673 Personal history of transient ischemic attack (TIA), and cerebral infarction without residual deficits: Secondary | ICD-10-CM | POA: Insufficient documentation

## 2016-06-12 DIAGNOSIS — F0390 Unspecified dementia without behavioral disturbance: Secondary | ICD-10-CM | POA: Diagnosis not present

## 2016-06-12 DIAGNOSIS — R569 Unspecified convulsions: Secondary | ICD-10-CM | POA: Insufficient documentation

## 2016-06-12 DIAGNOSIS — Z79899 Other long term (current) drug therapy: Secondary | ICD-10-CM | POA: Insufficient documentation

## 2016-06-12 DIAGNOSIS — I10 Essential (primary) hypertension: Secondary | ICD-10-CM | POA: Insufficient documentation

## 2016-06-12 DIAGNOSIS — G40909 Epilepsy, unspecified, not intractable, without status epilepticus: Secondary | ICD-10-CM | POA: Diagnosis not present

## 2016-06-12 LAB — COMPREHENSIVE METABOLIC PANEL
ALK PHOS: 75 U/L (ref 38–126)
ALT: 23 U/L (ref 17–63)
AST: 29 U/L (ref 15–41)
Albumin: 3.5 g/dL (ref 3.5–5.0)
Anion gap: 7 (ref 5–15)
BILIRUBIN TOTAL: 1.8 mg/dL — AB (ref 0.3–1.2)
BUN: 11 mg/dL (ref 6–20)
CALCIUM: 9.2 mg/dL (ref 8.9–10.3)
CHLORIDE: 108 mmol/L (ref 101–111)
CO2: 23 mmol/L (ref 22–32)
CREATININE: 1.09 mg/dL (ref 0.61–1.24)
Glucose, Bld: 157 mg/dL — ABNORMAL HIGH (ref 65–99)
Potassium: 4.1 mmol/L (ref 3.5–5.1)
Sodium: 138 mmol/L (ref 135–145)
Total Protein: 5.8 g/dL — ABNORMAL LOW (ref 6.5–8.1)

## 2016-06-12 LAB — CBC WITH DIFFERENTIAL/PLATELET
BASOS ABS: 0 10*3/uL (ref 0.0–0.1)
Basophils Relative: 0 %
EOS PCT: 2 %
Eosinophils Absolute: 0.1 10*3/uL (ref 0.0–0.7)
HEMATOCRIT: 41.1 % (ref 39.0–52.0)
HEMOGLOBIN: 13.8 g/dL (ref 13.0–17.0)
LYMPHS ABS: 0.5 10*3/uL — AB (ref 0.7–4.0)
LYMPHS PCT: 13 %
MCH: 29.1 pg (ref 26.0–34.0)
MCHC: 33.6 g/dL (ref 30.0–36.0)
MCV: 86.7 fL (ref 78.0–100.0)
Monocytes Absolute: 0.4 10*3/uL (ref 0.1–1.0)
Monocytes Relative: 9 %
NEUTROS ABS: 3 10*3/uL (ref 1.7–7.7)
Neutrophils Relative %: 76 %
PLATELETS: 178 10*3/uL (ref 150–400)
RBC: 4.74 MIL/uL (ref 4.22–5.81)
RDW: 12.9 % (ref 11.5–15.5)
WBC: 3.9 10*3/uL — AB (ref 4.0–10.5)

## 2016-06-12 LAB — CBG MONITORING, ED: GLUCOSE-CAPILLARY: 122 mg/dL — AB (ref 65–99)

## 2016-06-12 MED ORDER — PHENYTOIN SODIUM EXTENDED 300 MG PO CAPS: 300.0000 mg | ORAL_CAPSULE | Freq: Every day | ORAL | 1 refills | Status: DC

## 2016-06-12 MED ORDER — PHENYTOIN SODIUM EXTENDED 100 MG PO CAPS: 300.0000 mg | ORAL_CAPSULE | Freq: Two times a day (BID) | ORAL | 0 refills | Status: DC

## 2016-06-12 MED ORDER — PHENYTOIN 50 MG PO CHEW
300.0000 mg | CHEWABLE_TABLET | Freq: Once | ORAL | Status: AC
  Administered 2016-06-12: 300 mg via ORAL
  Filled 2016-06-12: qty 6

## 2016-06-12 NOTE — ED Notes (Signed)
[facility] contacted this RN to leave their number for contact when disposition for patient is decided upon. [phone]

## 2016-06-12 NOTE — ED Notes (Signed)
Patient's daughter in law at bedside, this RN advised family member that I could have Dr Maryan Rued come in and speak to her about patient's plan of care.

## 2016-06-12 NOTE — ED Notes (Signed)
ptar at bedside. Pt's vss. pts daughter in law at bedside to sign for discharge instructions

## 2016-06-12 NOTE — ED Notes (Signed)
MD at bedside. 

## 2016-06-12 NOTE — ED Notes (Signed)
[facility]; [employer], returned pt to room. Door left open.

## 2016-06-12 NOTE — ED Triage Notes (Signed)
Pt arrives via [facility] from [facility], ems reports staff went in to get patient dressed this am, pt was a/o at baseline, upon getting pt up from his chair, patient fell back into chair and had a tonic clonic seizure. Pt has no hx of seizures.ems reports pt was unresponsive upon their arrival. Pt remains somnolent with some twitching motions of legs and face observed.

## 2016-06-12 NOTE — ED Notes (Signed)
Patient transported to CT 

## 2016-06-12 NOTE — ED Notes (Signed)
All discharge paperwork given to ptar upon discharge.

## 2016-06-12 NOTE — ED Provider Notes (Signed)
[facility] DEPT Provider Note   CSN: [id number] Arrival date & time: 06/12/16  0807  First Provider Contact:  None       History   Chief Complaint Chief Complaint  Patient presents with  . Seizures    HPI Lucas Lee is a 70 y.o. male.  The history is provided by the EMS personnel and the nursing home. The history is limited by the absence of a caregiver.  Seizures   This is a new problem. The current episode started 1 to 2 hours ago. The problem has been resolved. There was 1 seizure. The most recent episode lasted 30 to 120 seconds. Associated symptoms include sleepiness. Characteristics include rhythmic jerking and loss of consciousness. The episode was witnessed. There was no sensation of an aura present. The seizures did not continue in the ED. The seizure(s) had no focality. Possible causes do not include recent illness. There has been no fever. There were no medications administered prior to arrival.    Past Medical History:  Diagnosis Date  . Dementia   . Hypertension   . Sleep apnea   . Stroke [facility])     There are no active problems to display for this patient.   Past Surgical History:  Procedure Laterality Date  . NOSE SURGERY         Home Medications    Prior to Admission medications   Medication Sig Start Date End Date Taking? Authorizing Provider  clopidogrel (PLAVIX) 75 MG tablet Take 1 tablet (75 mg total) by mouth daily. 10/13/14   Jola Schmidt, MD  doxycycline (DORYX) 100 MG EC tablet Take 100 mg by mouth daily.    Historical Provider, MD  galantamine (RAZADYNE) 8 MG tablet Take 8 mg by mouth 2 (two) times daily.    Historical Provider, MD  losartan (COZAAR) 50 MG tablet Take 50 mg by mouth daily.    Historical Provider, MD  Multiple Vitamin (MULTIVITAMIN WITH MINERALS) TABS tablet Take 1 tablet by mouth daily.    Historical Provider, MD  omeprazole (PRILOSEC) 40 MG capsule Take 40 mg by mouth daily.    Historical Provider, MD  POTASSIUM  CITRATE PO Take 1 tablet by mouth daily.    Historical Provider, MD  simvastatin (ZOCOR) 10 MG tablet Take 10 mg by mouth daily.    Historical Provider, MD  SIMVASTATIN PO Take 1 tablet by mouth daily.    Historical Provider, MD    Family History No family history on file.  Social History Social History  Substance Use Topics  . Smoking status: Never Smoker  . Smokeless tobacco: Not on file  . Alcohol use No     Allergies   Dilaudid [hydromorphone hcl]   Review of Systems Review of Systems  Unable to perform ROS: Dementia  Neurological: Positive for seizures and loss of consciousness.     Physical Exam Updated Vital Signs BP 113/64   Pulse 95   Temp 98.1 F (36.7 C)   Resp 16   SpO2 98%   Physical Exam  Constitutional: He appears well-developed and well-nourished. No distress.  HENT:  Head: Normocephalic and atraumatic.  Mouth/Throat: Oropharynx is clear and moist.  Eyes: Conjunctivae and EOM are normal. Pupils are equal, round, and reactive to light.  Neck: Normal range of motion. Neck supple.  Cardiovascular: Normal rate, regular rhythm and intact distal pulses.   No murmur heard. Pulmonary/Chest: Effort normal and breath sounds normal. No respiratory distress. He has no wheezes. He has no rales.  Abdominal: Soft. He exhibits no distension. There is no tenderness. There is no rebound and no guarding.  Musculoskeletal: Normal range of motion. He exhibits no edema or tenderness.  Neurological: He is alert. He has normal strength. He is disoriented. No cranial nerve deficit or sensory deficit. Coordination normal.  Will answer yes and no questions but not oriented to place or time  Skin: Skin is warm and dry. No rash noted. No erythema.  Psychiatric: He has a normal mood and affect. His behavior is normal.  Nursing note and vitals reviewed.    ED Treatments / Results  Labs (all labs ordered are listed, but only abnormal results are displayed) Labs Reviewed    CBC WITH DIFFERENTIAL/PLATELET - Abnormal; Notable for the following:       Result Value   WBC 3.9 (*)    Lymphs Abs 0.5 (*)    All other components within normal limits  COMPREHENSIVE METABOLIC PANEL - Abnormal; Notable for the following:    Glucose, Bld 157 (*)    Total Protein 5.8 (*)    Total Bilirubin 1.8 (*)    All other components within normal limits  CBG MONITORING, ED - Abnormal; Notable for the following:    Glucose-Capillary 122 (*)    All other components within normal limits    EKG  EKG Interpretation None      ED ECG REPORT   Date: 06/12/2016  Rate: 98  Rhythm: normal sinus rhythm  QRS Axis: normal  Intervals: normal  ST/T Wave abnormalities: normal  Conduction Disutrbances:none  Narrative Interpretation:   Old EKG Reviewed: none available  I have personally reviewed the EKG tracing and agree with the computerized printout as noted.  Radiology Ct Head Wo Contrast  Result Date: 06/12/2016 CLINICAL DATA:  First time seizure. EXAM: CT HEAD WITHOUT CONTRAST TECHNIQUE: Contiguous axial images were obtained from the base of the skull through the vertex without intravenous contrast. COMPARISON:  10/13/2014 FINDINGS: Brain: No evidence of acute infarction, hemorrhage, hydrocephalus, or mass lesion/mass effect. Remote left PCA territory infarct extending from the inferior temporal to occipital lobe. This involves extensive area of cortex and could be seizure focus. Bifrontal atrophy with ventriculomegaly, progressed from 2015. There is asymmetric involvement on the left with notable atrophy of the caudate nuclei. Patient has history of dementia. Vascular: No hyperdense vessel or unexpected calcification. Skull: Negative for fracture or focal lesion. Sinuses/Orbits: No acute finding. Other: None. IMPRESSION: 1. No acute finding. 2. Remote left PCA territory infarct with extensive cortical gliosis. 3. History of dementia with progressive frontal atrophy. Electronically  Signed   By: Monte Fantasia M.D.   On: 06/12/2016 09:19    Procedures Procedures (including critical care time)  Medications Ordered in ED Medications - No data to display   Initial Impression / Assessment and Plan / ED Course  I have reviewed the triage vital signs and the nursing notes.  Pertinent labs & imaging results that were available during my care of the patient were reviewed by me and considered in my medical decision making (see chart for details).  Clinical Course   Patient presenting today from the nursing home after having a witnessed seizure. He had tonic-clonic movement that lasted for less than 5 minutes and is now resolved. Because of patient's dementia and mental status he is unable to tell me any details. However he was pleasant, awake and cooperative. He has no focal weakness. Patient has had no prior history of seizures however has a history of  dementia lives in a memory care unitand prior history of stroke. Head CT,CBC, CMP pending. Will rule out space-occupying lesion, intracranial hemorrhage, hyponatremia as the cause of his symptoms. It could be that prior stroke and dementia are what caused his seizure today. Will discuss with neurology about starting dilantin.  Imaging was negative and labs within normal limits. Patient will start Dilantin 300 mg extended release at night Final Clinical Impressions(s) / ED Diagnoses   Final diagnoses:  Seizure ([place])    New Prescriptions New Prescriptions   PHENYTOIN (DILANTIN) 100 MG ER CAPSULE    Take 3 capsules (300 mg total) by mouth 2 (two) times daily. Need to take 300mg  at 1pm and then 400mg  at 3pm   PHENYTOIN (DILANTIN) 300 MG ER CAPSULE    Take 1 capsule (300 mg total) by mouth at bedtime.     Blanchie Dessert, MD 06/12/16 1056

## 2016-08-09 DIAGNOSIS — R569 Unspecified convulsions: Secondary | ICD-10-CM | POA: Diagnosis not present

## 2016-08-09 DIAGNOSIS — F325 Major depressive disorder, single episode, in full remission: Secondary | ICD-10-CM | POA: Diagnosis not present

## 2016-08-09 DIAGNOSIS — G3109 Other frontotemporal dementia: Secondary | ICD-10-CM | POA: Diagnosis not present

## 2016-08-09 DIAGNOSIS — Z1159 Encounter for screening for other viral diseases: Secondary | ICD-10-CM | POA: Diagnosis not present

## 2016-08-09 DIAGNOSIS — I10 Essential (primary) hypertension: Secondary | ICD-10-CM | POA: Diagnosis not present

## 2016-08-09 DIAGNOSIS — G4700 Insomnia, unspecified: Secondary | ICD-10-CM | POA: Diagnosis not present

## 2016-08-09 DIAGNOSIS — E7800 Pure hypercholesterolemia, unspecified: Secondary | ICD-10-CM | POA: Diagnosis not present

## 2016-08-16 DIAGNOSIS — Z23 Encounter for immunization: Secondary | ICD-10-CM | POA: Diagnosis not present

## 2017-02-11 ENCOUNTER — Emergency Department (HOSPITAL_COMMUNITY)
Admission: EM | Admit: 2017-02-11 | Discharge: 2017-02-11 | Disposition: A | Discharge status: Home / Self Care | Payer: Medicare Other | Attending: Emergency Medicine | Admitting: Emergency Medicine

## 2017-02-11 ENCOUNTER — Emergency Department (HOSPITAL_COMMUNITY): Payer: Medicare Other

## 2017-02-11 ENCOUNTER — Encounter (HOSPITAL_COMMUNITY): Payer: Self-pay

## 2017-02-11 DIAGNOSIS — G40909 Epilepsy, unspecified, not intractable, without status epilepticus: Secondary | ICD-10-CM | POA: Diagnosis not present

## 2017-02-11 DIAGNOSIS — I10 Essential (primary) hypertension: Secondary | ICD-10-CM | POA: Diagnosis not present

## 2017-02-11 DIAGNOSIS — R569 Unspecified convulsions: Secondary | ICD-10-CM

## 2017-02-11 DIAGNOSIS — Z8673 Personal history of transient ischemic attack (TIA), and cerebral infarction without residual deficits: Secondary | ICD-10-CM | POA: Insufficient documentation

## 2017-02-11 HISTORY — DX: Unspecified convulsions: R56.9

## 2017-02-11 LAB — URINALYSIS, ROUTINE W REFLEX MICROSCOPIC
Bilirubin Urine: NEGATIVE
GLUCOSE, UA: NEGATIVE mg/dL
Hgb urine dipstick: NEGATIVE
KETONES UR: NEGATIVE mg/dL
LEUKOCYTES UA: NEGATIVE
Nitrite: NEGATIVE
PH: 8 (ref 5.0–8.0)
Protein, ur: NEGATIVE mg/dL
SPECIFIC GRAVITY, URINE: 1.014 (ref 1.005–1.030)

## 2017-02-11 LAB — CBC WITH DIFFERENTIAL/PLATELET
Basophils Absolute: 0 10*3/uL (ref 0.0–0.1)
Basophils Relative: 0 %
EOS ABS: 0.1 10*3/uL (ref 0.0–0.7)
EOS PCT: 3 %
HCT: 40.1 % (ref 39.0–52.0)
HEMOGLOBIN: 13.5 g/dL (ref 13.0–17.0)
LYMPHS ABS: 0.8 10*3/uL (ref 0.7–4.0)
Lymphocytes Relative: 18 %
MCH: 29.2 pg (ref 26.0–34.0)
MCHC: 33.7 g/dL (ref 30.0–36.0)
MCV: 86.6 fL (ref 78.0–100.0)
MONOS PCT: 9 %
Monocytes Absolute: 0.4 10*3/uL (ref 0.1–1.0)
Neutro Abs: 3.1 10*3/uL (ref 1.7–7.7)
Neutrophils Relative %: 70 %
Platelets: 189 10*3/uL (ref 150–400)
RBC: 4.63 MIL/uL (ref 4.22–5.81)
RDW: 13.2 % (ref 11.5–15.5)
WBC: 4.4 10*3/uL (ref 4.0–10.5)

## 2017-02-11 LAB — COMPREHENSIVE METABOLIC PANEL
ALK PHOS: 66 U/L (ref 38–126)
ALT: 18 U/L (ref 17–63)
ANION GAP: 12 (ref 5–15)
AST: 30 U/L (ref 15–41)
Albumin: 3.5 g/dL (ref 3.5–5.0)
BILIRUBIN TOTAL: 1.3 mg/dL — AB (ref 0.3–1.2)
BUN: 17 mg/dL (ref 6–20)
CHLORIDE: 104 mmol/L (ref 101–111)
CO2: 22 mmol/L (ref 22–32)
CREATININE: 1.1 mg/dL (ref 0.61–1.24)
Calcium: 9.2 mg/dL (ref 8.9–10.3)
Glucose, Bld: 141 mg/dL — ABNORMAL HIGH (ref 65–99)
Potassium: 4 mmol/L (ref 3.5–5.1)
Sodium: 138 mmol/L (ref 135–145)
Total Protein: 6 g/dL — ABNORMAL LOW (ref 6.5–8.1)

## 2017-02-11 LAB — CBG MONITORING, ED: Glucose-Capillary: 135 mg/dL — ABNORMAL HIGH (ref 65–99)

## 2017-02-11 LAB — PHENYTOIN LEVEL, TOTAL

## 2017-02-11 MED ORDER — SODIUM CHLORIDE 0.9 % IV SOLN
10.0000 mg/kg | Freq: Once | INTRAVENOUS | Status: AC
  Administered 2017-02-11: 900 mg via INTRAVENOUS
  Filled 2017-02-11: qty 18

## 2017-02-11 MED ORDER — PHENYTOIN SODIUM EXTENDED 300 MG PO CAPS: 300.0000 mg | ORAL_CAPSULE | Freq: Every day | ORAL | 0 refills | Status: DC

## 2017-02-11 NOTE — ED Notes (Signed)
Pt had an incontinent void. Pt changed and repositioned in bed.

## 2017-02-11 NOTE — ED Notes (Signed)
Patient transported to CT 

## 2017-02-11 NOTE — ED Provider Notes (Signed)
[facility] DEPT Provider Note   CSN: [id number] Arrival date & time: 02/11/17  1403     History   Chief Complaint Chief Complaint  Patient presents with  . Seizures    HPI Lucas Lee is a 71 y.o. male.  HPI   71 year old male with history of vascular dementia and single seizure in the past who presents with seizure episode. Patient reportedly was in his usual state of health yesterday. His wife states that he was walking with her for 45 minutes yesterday and seemed to be at his baseline. She was notified today that the patient was reportedly walking back to his room from the restroom when he began having generalized seizure-like activity. This lasted several minutes. On her arrival, he was minimally responsive but had no further shaking episodes. He was not incontinent of bowel or bladder but had just gone to the bathroom. No falls. He was supported by staff throughout the episode. Currently, patient is reportedly at his baseline. He does have a chronic cough that seems to be mildly worsened. Family denies any recent medication changes. Denies any recent falls.  Level 5 caveat invoked as remainder of history, ROS, and physical exam limited due to patient's dementia.   Past Medical History:  Diagnosis Date  . Dementia   . Hypertension   . Seizures ([place])   . Sleep apnea   . Stroke [facility])     There are no active problems to display for this patient.   Past Surgical History:  Procedure Laterality Date  . NOSE SURGERY         Home Medications    Prior to Admission medications   Medication Sig Start Date End Date Taking? Authorizing Provider  aspirin 81 MG tablet Take 81 mg by mouth daily.   Yes Historical Provider, MD  doxycycline (DORYX) 100 MG EC tablet Take 100 mg by mouth 2 (two) times daily.    Yes Historical Provider, MD  losartan (COZAAR) 50 MG tablet Take 50 mg by mouth daily.   Yes Historical Provider, MD  sertraline (ZOLOFT) 50 MG tablet Take 50 mg by  mouth daily.   Yes Historical Provider, MD  simvastatin (ZOCOR) 10 MG tablet Take 10 mg by mouth daily at 6 PM.    Yes Historical Provider, MD  traZODone (DESYREL) 50 MG tablet Take 50 mg by mouth at bedtime.   Yes Historical Provider, MD  phenytoin (DILANTIN) 300 MG ER capsule Take 1 capsule (300 mg total) by mouth at bedtime. 02/11/17 03/13/17  Duffy Bruce, MD    Family History History reviewed. No pertinent family history.  Social History Social History  Substance Use Topics  . Smoking status: Never Smoker  . Smokeless tobacco: Never Used  . Alcohol use No     Allergies   Hydromorphone   Review of Systems Review of Systems  Unable to perform ROS: Dementia     Physical Exam Updated Vital Signs BP (!) 143/72   Pulse 69   Temp 97.1 F (36.2 C) (Axillary)   Resp 13   Wt 198 lb 6.6 oz (90 kg)   SpO2 99%   BMI 27.29 kg/m   Physical Exam  Constitutional: He appears well-developed and well-nourished. No distress.  HENT:  Head: Normocephalic and atraumatic.  Small contusion to lateral aspect of tongue. No active bleeding.  Eyes: Conjunctivae are normal.  Neck: Neck supple.  Cardiovascular: Normal rate, regular rhythm and normal heart sounds.  Exam reveals no friction rub.   No  murmur heard. Pulmonary/Chest: Effort normal and breath sounds normal. No respiratory distress. He has no wheezes. He has no rales.  Abdominal: He exhibits no distension.  Musculoskeletal: He exhibits no edema.  Neurological: He is alert. He exhibits normal muscle tone.  Minimally verbal. Face symmetric. MAE with 5/5 strength. Follows commands. Tongue is midline. Endorses intact sensation to light touch b/l UE and LE.  Skin: Skin is warm. Capillary refill takes less than 2 seconds.  Psychiatric: He has a normal mood and affect.  Nursing note and vitals reviewed.    ED Treatments / Results  Labs (all labs ordered are listed, but only abnormal results are displayed) Labs Reviewed    COMPREHENSIVE METABOLIC PANEL - Abnormal; Notable for the following:       Result Value   Glucose, Bld 141 (*)    Total Protein 6.0 (*)    Total Bilirubin 1.3 (*)    All other components within normal limits  PHENYTOIN LEVEL, TOTAL - Abnormal; Notable for the following:    Phenytoin Lvl <2.5 (*)    All other components within normal limits  URINALYSIS, ROUTINE W REFLEX MICROSCOPIC - Abnormal; Notable for the following:    APPearance CLOUDY (*)    All other components within normal limits  CBG MONITORING, ED - Abnormal; Notable for the following:    Glucose-Capillary 135 (*)    All other components within normal limits  CBC WITH DIFFERENTIAL/PLATELET    EKG  EKG Interpretation  Date/Time:  Tuesday February 11 2017 14:11:58 EDT Ventricular Rate:  93 PR Interval:    QRS Duration: 84 QT Interval:  373 QTC Calculation: 464 R Axis:   -5 Text Interpretation:  Sinus rhythm Prolonged PR interval no singificant change since Aug 2017 Confirmed by Regenia Skeeter MD, SCOTT [phone]) on 02/11/2017 2:19:14 PM       Radiology Dg Chest 2 View  Result Date: 02/11/2017 CLINICAL DATA:  Seizures EXAM: CHEST  2 VIEW COMPARISON:  None. FINDINGS: There is no edema or consolidation. Heart size and pulmonary vascularity are normal. No adenopathy. Aorta is mildly prominent. No pneumothorax. No bone lesions. IMPRESSION: Aorta mildly prominent. This finding may be indicative of chronic hypertension. No edema or consolidation. Electronically Signed   By: Lowella Grip III M.D.   On: 02/11/2017 16:03   Ct Head Wo Contrast  Result Date: 02/11/2017 CLINICAL DATA:  Seizure today. Previous history of seizures and strokes. EXAM: CT HEAD WITHOUT CONTRAST TECHNIQUE: Contiguous axial images were obtained from the base of the skull through the vertex without intravenous contrast. COMPARISON:  06/12/2016 FINDINGS: Brain: The brain shows generalized atrophy with chronic small-vessel ischemic changes affecting the white matter,  frontal predominant. Ex vacuo enlargement of the frontal horns of lateral ventricles. Chronic small-vessel ischemic changes affect the thalami. There is old infarction in the left PCA territory affecting the posteromedial temporal lobe and occipital lobe. No acute finding. No hemorrhage, mass or extra-axial collection. Vascular: There is atherosclerotic calcification of the major vessels at the base of the brain. Skull: Negative Sinuses/Orbits: Mild mucosal sinus inflammation.  Orbits negative. Other: None IMPRESSION: No acute finding by CT. Atrophy and chronic small-vessel ischemic changes. Old left PCA territory infarction. Electronically Signed   By: Nelson Chimes M.D.   On: 02/11/2017 16:27    Procedures Procedures (including critical care time)  Medications Ordered in ED Medications  phenytoin (DILANTIN) 900 mg in sodium chloride 0.9 % 250 mL IVPB (0 mg/kg  90 kg Intravenous Stopped 02/11/17 2021)  Initial Impression / Assessment and Plan / ED Course  I have reviewed the triage vital signs and the nursing notes.  Pertinent labs & imaging results that were available during my care of the patient were reviewed by me and considered in my medical decision making (see chart for details).    71 year old male with history of vascular dementia here with witnessed seizure. On exam, patient is back to his neurological baseline. He has mild tongue trauma consistent with seizure. Screening lab work is unremarkable. He has no apparent trigger for his seizure including no evidence of pneumonia, likely abnormality, or infection. Urinalysis is pending. CT head is negative. I suspect this is secondary to his underlying organic brain disease. Will consult neurology for evaluation and recommendations.  UA without UTI. I discussed with Neurology - will load with Dilantin here, d/c with outpt Dilantin 300 qHS. Family is comfortable with this plan. Will d/c home. He is ambulatory without difficulty, no other  acute issues.   Final Clinical Impressions(s) / ED Diagnoses   Final diagnoses:  Seizure [facility])    New Prescriptions Discharge Medication List as of 02/11/2017  7:21 PM       Duffy Bruce, MD 02/11/17 2345

## 2017-02-11 NOTE — ED Triage Notes (Signed)
Pt from [facility] with possible seizure. Pt was in room and called out and fell. Pt had hands shaking and more confused.

## 2017-02-13 DIAGNOSIS — I10 Essential (primary) hypertension: Secondary | ICD-10-CM | POA: Diagnosis not present

## 2017-02-13 DIAGNOSIS — E7800 Pure hypercholesterolemia, unspecified: Secondary | ICD-10-CM | POA: Diagnosis not present

## 2017-02-25 DIAGNOSIS — G4700 Insomnia, unspecified: Secondary | ICD-10-CM | POA: Diagnosis not present

## 2017-02-25 DIAGNOSIS — L719 Rosacea, unspecified: Secondary | ICD-10-CM | POA: Diagnosis not present

## 2017-02-25 DIAGNOSIS — G40909 Epilepsy, unspecified, not intractable, without status epilepticus: Secondary | ICD-10-CM | POA: Diagnosis not present

## 2017-02-25 DIAGNOSIS — F325 Major depressive disorder, single episode, in full remission: Secondary | ICD-10-CM | POA: Diagnosis not present

## 2017-02-25 DIAGNOSIS — M8580 Other specified disorders of bone density and structure, unspecified site: Secondary | ICD-10-CM | POA: Diagnosis not present

## 2017-02-25 DIAGNOSIS — Z0000 Encounter for general adult medical examination without abnormal findings: Secondary | ICD-10-CM | POA: Diagnosis not present

## 2017-02-25 DIAGNOSIS — E7800 Pure hypercholesterolemia, unspecified: Secondary | ICD-10-CM | POA: Diagnosis not present

## 2017-02-25 DIAGNOSIS — I10 Essential (primary) hypertension: Secondary | ICD-10-CM | POA: Diagnosis not present

## 2017-02-25 DIAGNOSIS — Z23 Encounter for immunization: Secondary | ICD-10-CM | POA: Diagnosis not present

## 2017-02-25 DIAGNOSIS — G3109 Other frontotemporal dementia: Secondary | ICD-10-CM | POA: Diagnosis not present

## 2017-03-21 DIAGNOSIS — Z79899 Other long term (current) drug therapy: Secondary | ICD-10-CM | POA: Diagnosis not present

## 2017-03-21 DIAGNOSIS — Z8669 Personal history of other diseases of the nervous system and sense organs: Secondary | ICD-10-CM | POA: Diagnosis not present

## 2017-04-03 DIAGNOSIS — M25472 Effusion, left ankle: Secondary | ICD-10-CM | POA: Diagnosis not present

## 2017-05-02 DIAGNOSIS — R600 Localized edema: Secondary | ICD-10-CM | POA: Diagnosis not present

## 2017-05-07 DIAGNOSIS — R2681 Unsteadiness on feet: Secondary | ICD-10-CM | POA: Diagnosis not present

## 2017-05-12 DIAGNOSIS — R2681 Unsteadiness on feet: Secondary | ICD-10-CM | POA: Diagnosis not present

## 2017-05-16 DIAGNOSIS — R2681 Unsteadiness on feet: Secondary | ICD-10-CM | POA: Diagnosis not present

## 2017-05-20 DIAGNOSIS — R2681 Unsteadiness on feet: Secondary | ICD-10-CM | POA: Diagnosis not present

## 2017-05-21 DIAGNOSIS — R2681 Unsteadiness on feet: Secondary | ICD-10-CM | POA: Diagnosis not present

## 2017-05-23 DIAGNOSIS — R2681 Unsteadiness on feet: Secondary | ICD-10-CM | POA: Diagnosis not present

## 2017-05-27 DIAGNOSIS — R2681 Unsteadiness on feet: Secondary | ICD-10-CM | POA: Diagnosis not present

## 2017-05-28 DIAGNOSIS — R2681 Unsteadiness on feet: Secondary | ICD-10-CM | POA: Diagnosis not present

## 2017-05-30 DIAGNOSIS — R2681 Unsteadiness on feet: Secondary | ICD-10-CM | POA: Diagnosis not present

## 2017-06-01 ENCOUNTER — Encounter (HOSPITAL_COMMUNITY): Payer: Self-pay

## 2017-06-01 ENCOUNTER — Emergency Department (HOSPITAL_COMMUNITY): Payer: Medicare Other

## 2017-06-01 ENCOUNTER — Emergency Department (HOSPITAL_COMMUNITY)
Admission: EM | Admit: 2017-06-01 | Discharge: 2017-06-01 | Disposition: A | Discharge status: Home / Self Care | Payer: Medicare Other | Attending: Emergency Medicine | Admitting: Emergency Medicine

## 2017-06-01 DIAGNOSIS — R404 Transient alteration of awareness: Secondary | ICD-10-CM | POA: Diagnosis not present

## 2017-06-01 DIAGNOSIS — R7889 Finding of other specified substances, not normally found in blood: Secondary | ICD-10-CM

## 2017-06-01 DIAGNOSIS — R892 Abnormal level of other drugs, medicaments and biological substances in specimens from other organs, systems and tissues: Secondary | ICD-10-CM | POA: Diagnosis not present

## 2017-06-01 DIAGNOSIS — R4701 Aphasia: Secondary | ICD-10-CM

## 2017-06-01 DIAGNOSIS — Z79899 Other long term (current) drug therapy: Secondary | ICD-10-CM | POA: Diagnosis not present

## 2017-06-01 DIAGNOSIS — I639 Cerebral infarction, unspecified: Secondary | ICD-10-CM

## 2017-06-01 DIAGNOSIS — R29818 Other symptoms and signs involving the nervous system: Secondary | ICD-10-CM | POA: Diagnosis not present

## 2017-06-01 DIAGNOSIS — R531 Weakness: Secondary | ICD-10-CM | POA: Diagnosis not present

## 2017-06-01 DIAGNOSIS — I10 Essential (primary) hypertension: Secondary | ICD-10-CM | POA: Diagnosis not present

## 2017-06-01 DIAGNOSIS — G464 Cerebellar stroke syndrome: Secondary | ICD-10-CM | POA: Diagnosis not present

## 2017-06-01 LAB — I-STAT CHEM 8, ED
BUN: 20 mg/dL (ref 6–20)
CHLORIDE: 105 mmol/L (ref 101–111)
Calcium, Ion: 1.16 mmol/L (ref 1.15–1.40)
Creatinine, Ser: 1 mg/dL (ref 0.61–1.24)
Glucose, Bld: 150 mg/dL — ABNORMAL HIGH (ref 65–99)
HCT: 44 % (ref 39.0–52.0)
HEMOGLOBIN: 15 g/dL (ref 13.0–17.0)
POTASSIUM: 4.1 mmol/L (ref 3.5–5.1)
SODIUM: 139 mmol/L (ref 135–145)
TCO2: 21 mmol/L (ref 0–100)

## 2017-06-01 LAB — COMPREHENSIVE METABOLIC PANEL
ALBUMIN: 4.2 g/dL (ref 3.5–5.0)
ALT: 22 U/L (ref 17–63)
ANION GAP: 9 (ref 5–15)
AST: 25 U/L (ref 15–41)
Alkaline Phosphatase: 102 U/L (ref 38–126)
BUN: 18 mg/dL (ref 6–20)
CHLORIDE: 106 mmol/L (ref 101–111)
CO2: 22 mmol/L (ref 22–32)
Calcium: 9.6 mg/dL (ref 8.9–10.3)
Creatinine, Ser: 1.1 mg/dL (ref 0.61–1.24)
GFR calc Af Amer: >60 mL/min (ref >60)
GFR calc non Af Amer: >60 mL/min (ref >60)
GLUCOSE: 149 mg/dL — AB (ref 65–99)
POTASSIUM: 4.1 mmol/L (ref 3.5–5.1)
SODIUM: 137 mmol/L (ref 135–145)
Total Bilirubin: 0.7 mg/dL (ref 0.3–1.2)
Total Protein: 6.7 g/dL (ref 6.5–8.1)

## 2017-06-01 LAB — CBC
HCT: 43.2 % (ref 39.0–52.0)
Hemoglobin: 14.6 g/dL (ref 13.0–17.0)
MCH: 29.1 pg (ref 26.0–34.0)
MCHC: 33.8 g/dL (ref 30.0–36.0)
MCV: 86.1 fL (ref 78.0–100.0)
PLATELETS: 239 10*3/uL (ref 150–400)
RBC: 5.02 MIL/uL (ref 4.22–5.81)
RDW: 13.1 % (ref 11.5–15.5)
WBC: 6.4 10*3/uL (ref 4.0–10.5)

## 2017-06-01 LAB — PROTIME-INR
INR: 1.05
PROTHROMBIN TIME: 13.7 s (ref 11.4–15.2)

## 2017-06-01 LAB — DIFFERENTIAL
BASOS PCT: 0 %
Basophils Absolute: 0 10*3/uL (ref 0.0–0.1)
EOS ABS: 0.1 10*3/uL (ref 0.0–0.7)
EOS PCT: 1 %
Lymphocytes Relative: 13 %
Lymphs Abs: 0.8 10*3/uL (ref 0.7–4.0)
Monocytes Absolute: 0.7 10*3/uL (ref 0.1–1.0)
Monocytes Relative: 11 %
Neutro Abs: 4.8 10*3/uL (ref 1.7–7.7)
Neutrophils Relative %: 75 %

## 2017-06-01 LAB — PHENYTOIN LEVEL, TOTAL: PHENYTOIN LVL: 5.8 ug/mL — AB (ref 10.0–20.0)

## 2017-06-01 LAB — I-STAT TROPONIN, ED: Troponin i, poc: 0 ng/mL (ref 0.00–0.08)

## 2017-06-01 LAB — CBG MONITORING, ED: GLUCOSE-CAPILLARY: 123 mg/dL — AB (ref 65–99)

## 2017-06-01 LAB — APTT: aPTT: 32 seconds (ref 24–36)

## 2017-06-01 MED ORDER — PHENYTOIN SODIUM EXTENDED 200 MG PO CAPS: 400.0000 mg | ORAL_CAPSULE | Freq: Every day | ORAL | 0 refills | Status: AC

## 2017-06-01 MED ORDER — PHENYTOIN 50 MG PO CHEW
150.0000 mg | CHEWABLE_TABLET | Freq: Once | ORAL | Status: AC
  Administered 2017-06-01: 150 mg via ORAL
  Filled 2017-06-01: qty 3

## 2017-06-01 NOTE — ED Notes (Signed)
Patient transported to MRI 

## 2017-06-01 NOTE — Discharge Instructions (Signed)
Please contact your neurology team at [facility] for additional recommendations

## 2017-06-01 NOTE — ED Triage Notes (Signed)
Pt presents to the ed as a code stroke from EMS, he is a resident at [facility]. At 0645 staff states he was leaning to the left sot hey called 911, in route with ems he was found to be weak on the right side and a code stroke was called, met at the bridge by neurologist and taken straight to ct 

## 2017-06-01 NOTE — ED Provider Notes (Addendum)
[facility] DEPT Provider Note   CSN: [id number] Arrival date & time: 06/01/17  6301   An emergency department physician performed an initial assessment on this suspected stroke patient at 0933.  History   Chief Complaint Chief Complaint  Patient presents with  . Code Stroke   Level V caveat: Dementia  HPI Lucas Lee is a 71 y.o. male.  HPI Patient is a 71 year old male who reportedly has advanced dementia and resides at a memory care unit.  He presents emergency department as a code stroke with new right-sided weakness and inability to speak.  Family reports that his speech is minimal at baseline but that this is somewhat abnormal.  Patient also has a history of seizure.  No seizure activity noted by nursing facility.   Past Medical History:  Diagnosis Date  . Dementia   . Hypertension   . Seizures ([place])   . Sleep apnea   . Stroke [facility])     There are no active problems to display for this patient.   Past Surgical History:  Procedure Laterality Date  . NOSE SURGERY         Home Medications    Prior to Admission medications   Medication Sig Start Date End Date Taking? Authorizing Provider  acetaminophen (TYLENOL) 325 MG tablet Take 650 mg by mouth every 6 (six) hours as needed for moderate pain.   Yes [provider]  aspirin 81 MG tablet Take 81 mg by mouth daily.   Yes [provider]  chlorhexidine (PERIDEX) 0.12 % solution Use as directed 15 mLs in the mouth or throat See admin instructions. Rinse 15 mg by mouth twice daily   Yes [provider]  doxycycline (VIBRAMYCIN) 100 MG capsule Take 100 mg by mouth 2 (two) times daily. 05/31/17  Yes [provider]  losartan (COZAAR) 50 MG tablet Take 50 mg by mouth daily.   Yes [provider]  sertraline (ZOLOFT) 50 MG tablet Take 50 mg by mouth daily.   Yes [provider]  simvastatin (ZOCOR) 10 MG tablet Take 10 mg by mouth daily at 6 PM.    Yes [provider]  traZODone (DESYREL) 50 MG tablet Take 50 mg by mouth at bedtime.   Yes [provider]  phenytoin (DILANTIN) 200 MG ER capsule Take 2 capsules (400 mg total) by mouth at bedtime. 06/01/17 07/01/17  Jola Schmidt, MD    Family History No family history on file.  Social History Social History  Substance Use Topics  . Smoking status: Never Smoker  . Smokeless tobacco: Never Used  . Alcohol use No     Allergies   Hydromorphone   Review of Systems Review of Systems  Unable to perform ROS: Dementia     Physical Exam Updated Vital Signs BP 137/84   Pulse 94   Temp 98.2 F (36.8 C)   Resp (!) 21   Ht 6' (1.829 m)   Wt 96.6 kg (213 lb)   SpO2 97%   BMI 28.89 kg/m   Physical Exam  Constitutional: He appears well-developed and well-nourished.  HENT:  Head: Normocephalic and atraumatic.  Eyes: EOM are normal.  Neck: Normal range of motion.  Cardiovascular: Normal rate, regular rhythm, normal heart sounds and intact distal pulses.   Pulmonary/Chest: Effort normal and breath sounds normal. No respiratory distress.  Abdominal: Soft. He exhibits no distension. There is no tenderness.  Musculoskeletal: Normal range of motion.  Neurological: He is alert.  Follow simple  commands.  Mild weakness of the right lower extremity as compared to left.  Able to hold his right arm and left arm above his head without drift  Skin: Skin is warm and dry.  Psychiatric: He has a normal mood and affect. Judgment normal.  Nursing note and vitals reviewed.    ED Treatments / Results  Labs (all labs ordered are listed, but only abnormal results are displayed) Labs Reviewed  COMPREHENSIVE METABOLIC PANEL - Abnormal; Notable for the following:       Result Value   Glucose, Bld 149 (*)    All other components within normal limits  PHENYTOIN LEVEL, TOTAL - Abnormal; Notable for the following:    Phenytoin Lvl 5.8 (*)    All other components within normal limits  CBG  MONITORING, ED - Abnormal; Notable for the following:    Glucose-Capillary 123 (*)    All other components within normal limits  I-STAT CHEM 8, ED - Abnormal; Notable for the following:    Glucose, Bld 150 (*)    All other components within normal limits  PROTIME-INR  APTT  CBC  DIFFERENTIAL  I-STAT TROPONIN, ED    EKG  EKG Interpretation  Date/Time:  Sunday June 01 2017 09:53:08 EDT Ventricular Rate:  94 PR Interval:    QRS Duration: 79 QT Interval:  345 QTC Calculation: 432 R Axis:   -22 Text Interpretation:  Sinus rhythm Borderline left axis deviation No significant change was found Confirmed by Jola Schmidt [phone]) on 06/01/2017 12:54:22 PM       Radiology Mr Brain Wo Contrast  Result Date: 06/01/2017 CLINICAL DATA:  Stroke.  Right-sided weakness. EXAM: MRI HEAD WITHOUT CONTRAST TECHNIQUE: Multiplanar, multiecho pulse sequences of the brain and surrounding structures were obtained without intravenous contrast. COMPARISON:  CT head 06/01/2017 FINDINGS: Brain: Chronic infarct of left medial temporal lobe extending into the occipital lobe. This is unchanged. Chronic microvascular ischemic change in the white matter. Chronic alignment infarcts bilaterally. Chronic right cerebellar infarct. Diffuse atrophy. Ventricular enlargement most prominent in the frontal lobes due to extensive frontal lobe atrophy and temporal lobe atrophy. Vascular: Normal arterial flow voids. Skull and upper cervical spine: Negative Sinuses/Orbits: Negative Other: None IMPRESSION: Frontotemporal lobe atrophy which is prominent Chronic ischemic changes as above.  No acute infarct. Electronically Signed   By: Franchot Gallo M.D.   On: 06/01/2017 11:01   Ct Head Code Stroke W/o Cm  Result Date: 06/01/2017 CLINICAL DATA:  Code stroke.  Left-sided weakness EXAM: CT HEAD WITHOUT CONTRAST TECHNIQUE: Contiguous axial images were obtained from the base of the skull through the vertex without intravenous contrast.  COMPARISON:  CT 02/11/2017 FINDINGS: Brain: Chronic left posterior cerebral artery infarct involving the hippocampus, temporal lobe, and occipital lobe unchanged from prior studies. Chronic ischemia in the white matter. Small chronic infarcts in the cerebellum bilaterally. Mild atrophy. Negative for hemorrhage or mass Vascular: Negative for hyperdense vessel Skull: Negative Sinuses/Orbits: Mild mucosal edema paranasal sinuses.  Normal orbit. Other: None ASPECTS ([place] Stroke Program Early CT Score) - Ganglionic level infarction (caudate, lentiform nuclei, internal capsule, insula, M1-M3 cortex): 7 - Supraganglionic infarction (M4-M6 cortex): 3 Total score (0-10 with 10 being normal): 10 IMPRESSION: 1. No acute intracranial abnormality. 2. ASPECTS is 10 Chronic left PCA infarct. Chronic microvascular ischemic change in the white matter. These results were called by telephone at the time of interpretation on 06/01/2017 at 10:01 am to Dr. Leonel Ramsay, who verbally acknowledged these results. Electronically Signed   By: Juanda Crumble  Carlis Abbott M.D.   On: 06/01/2017 10:02    Procedures Procedures (including critical care time)  Medications Ordered in ED Medications  phenytoin (DILANTIN) chewable tablet 150 mg (150 mg Oral Given 06/01/17 1223)     Initial Impression / Assessment and Plan / ED Course  I have reviewed the triage vital signs and the nursing notes.  Pertinent labs & imaging results that were available during my care of the patient were reviewed by me and considered in my medical decision making (see chart for details).     Code stroke on arrival.  Seen immediately by myself and the neurology team.  Given his advanced dementia and his minimal communication to begin with the risk of TPA outweighs the benefit.  Patient will be sent for stat MRI.  12:34 PM MRI negative for acute stroke.  There is history now coming from the nursing home that there may have been seizure-like activity prior to the  event.  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  His mental status is much improved, he has a return to baseline mental status per his spouse.  Subtherapeutic Dilantin level despite 300 daily at bedtime extended release Dilantin.  Patient be increased to 400 mg daily at bedtime.  Recommended primary care and patient neurology follow-up.  Family understands to return the patient to the ER for new or worsening or concerning symptoms  Final Clinical Impressions(s) / ED Diagnoses   Final diagnoses:  Aphasia  Subtherapeutic serum dilantin level    New Prescriptions Current Discharge Medication List       Jola Schmidt, MD 06/01/17 [place]    Jola Schmidt, MD 06/01/17 1254

## 2017-06-01 NOTE — ED Notes (Addendum)
Family at bedside with neurologist

## 2017-06-01 NOTE — ED Notes (Addendum)
Patient transported to MRI with Laurence Aly

## 2017-06-03 DIAGNOSIS — R2681 Unsteadiness on feet: Secondary | ICD-10-CM | POA: Diagnosis not present

## 2017-06-04 DIAGNOSIS — R2681 Unsteadiness on feet: Secondary | ICD-10-CM | POA: Diagnosis not present

## 2017-06-05 DIAGNOSIS — R2681 Unsteadiness on feet: Secondary | ICD-10-CM | POA: Diagnosis not present

## 2017-06-10 DIAGNOSIS — R2681 Unsteadiness on feet: Secondary | ICD-10-CM | POA: Diagnosis not present

## 2017-06-13 DIAGNOSIS — R2681 Unsteadiness on feet: Secondary | ICD-10-CM | POA: Diagnosis not present

## 2017-06-16 ENCOUNTER — Emergency Department (HOSPITAL_COMMUNITY)
Admission: EM | Admit: 2017-06-16 | Discharge: 2017-06-16 | Disposition: A | Discharge status: Home / Self Care | Payer: Medicare Other | Attending: Emergency Medicine | Admitting: Emergency Medicine

## 2017-06-16 ENCOUNTER — Encounter (HOSPITAL_COMMUNITY): Payer: Self-pay

## 2017-06-16 ENCOUNTER — Emergency Department (HOSPITAL_COMMUNITY): Payer: Medicare Other

## 2017-06-16 DIAGNOSIS — R4701 Aphasia: Secondary | ICD-10-CM | POA: Diagnosis not present

## 2017-06-16 DIAGNOSIS — Z79899 Other long term (current) drug therapy: Secondary | ICD-10-CM | POA: Diagnosis not present

## 2017-06-16 DIAGNOSIS — G40909 Epilepsy, unspecified, not intractable, without status epilepticus: Secondary | ICD-10-CM | POA: Diagnosis not present

## 2017-06-16 DIAGNOSIS — I10 Essential (primary) hypertension: Secondary | ICD-10-CM | POA: Insufficient documentation

## 2017-06-16 DIAGNOSIS — F0390 Unspecified dementia without behavioral disturbance: Secondary | ICD-10-CM | POA: Insufficient documentation

## 2017-06-16 DIAGNOSIS — R404 Transient alteration of awareness: Secondary | ICD-10-CM | POA: Diagnosis present

## 2017-06-16 DIAGNOSIS — R569 Unspecified convulsions: Secondary | ICD-10-CM | POA: Insufficient documentation

## 2017-06-16 DIAGNOSIS — R05 Cough: Secondary | ICD-10-CM | POA: Diagnosis not present

## 2017-06-16 DIAGNOSIS — R4182 Altered mental status, unspecified: Secondary | ICD-10-CM | POA: Diagnosis not present

## 2017-06-16 DIAGNOSIS — R0902 Hypoxemia: Secondary | ICD-10-CM | POA: Diagnosis not present

## 2017-06-16 LAB — CBC WITH DIFFERENTIAL/PLATELET
BASOS ABS: 0 10*3/uL (ref 0.0–0.1)
BASOS PCT: 0 %
EOS PCT: 0 %
Eosinophils Absolute: 0 10*3/uL (ref 0.0–0.7)
HCT: 44.8 % (ref 39.0–52.0)
Hemoglobin: 15.5 g/dL (ref 13.0–17.0)
LYMPHS PCT: 7 %
Lymphs Abs: 0.7 10*3/uL (ref 0.7–4.0)
MCH: 30 pg (ref 26.0–34.0)
MCHC: 34.6 g/dL (ref 30.0–36.0)
MCV: 86.7 fL (ref 78.0–100.0)
MONO ABS: 1 10*3/uL (ref 0.1–1.0)
Monocytes Relative: 10 %
Neutro Abs: 8.2 10*3/uL — ABNORMAL HIGH (ref 1.7–7.7)
Neutrophils Relative %: 83 %
PLATELETS: 230 10*3/uL (ref 150–400)
RBC: 5.17 MIL/uL (ref 4.22–5.81)
RDW: 13.1 % (ref 11.5–15.5)
WBC: 9.8 10*3/uL (ref 4.0–10.5)

## 2017-06-16 LAB — COMPREHENSIVE METABOLIC PANEL
ALT: 27 U/L (ref 17–63)
ANION GAP: 9 (ref 5–15)
AST: 39 U/L (ref 15–41)
Albumin: 4.2 g/dL (ref 3.5–5.0)
Alkaline Phosphatase: 117 U/L (ref 38–126)
BUN: 19 mg/dL (ref 6–20)
CHLORIDE: 105 mmol/L (ref 101–111)
CO2: 26 mmol/L (ref 22–32)
Calcium: 9.4 mg/dL (ref 8.9–10.3)
Creatinine, Ser: 1.36 mg/dL — ABNORMAL HIGH (ref 0.61–1.24)
GFR, EST AFRICAN AMERICAN: 59 mL/min — AB (ref >60)
GFR, EST NON AFRICAN AMERICAN: 51 mL/min — AB (ref >60)
Glucose, Bld: 137 mg/dL — ABNORMAL HIGH (ref 65–99)
POTASSIUM: 4.7 mmol/L (ref 3.5–5.1)
Sodium: 140 mmol/L (ref 135–145)
Total Bilirubin: 1 mg/dL (ref 0.3–1.2)
Total Protein: 7.4 g/dL (ref 6.5–8.1)

## 2017-06-16 LAB — URINALYSIS, ROUTINE W REFLEX MICROSCOPIC
Bilirubin Urine: NEGATIVE
Glucose, UA: NEGATIVE mg/dL
Hgb urine dipstick: NEGATIVE
Ketones, ur: NEGATIVE mg/dL
LEUKOCYTES UA: NEGATIVE
NITRITE: NEGATIVE
Protein, ur: NEGATIVE mg/dL
SPECIFIC GRAVITY, URINE: 1.013 (ref 1.005–1.030)
pH: 7 (ref 5.0–8.0)

## 2017-06-16 LAB — PHENYTOIN LEVEL, TOTAL

## 2017-06-16 LAB — LACTIC ACID, PLASMA: LACTIC ACID, VENOUS: 1.8 mmol/L (ref 0.5–1.9)

## 2017-06-16 MED ORDER — LEVOFLOXACIN IN D5W 500 MG/100ML IV SOLN: 500.0000 mg | Freq: Once | INTRAVENOUS | Status: DC

## 2017-06-16 MED ORDER — DEXTROSE 5 % IV SOLN
500.0000 mg | Freq: Once | INTRAVENOUS | Status: AC
  Administered 2017-06-16: 500 mg via INTRAVENOUS
  Filled 2017-06-16: qty 500

## 2017-06-16 MED ORDER — AZITHROMYCIN 250 MG PO TABS: 250.0000 mg | ORAL_TABLET | Freq: Every day | ORAL | 0 refills | Status: AC

## 2017-06-16 MED ORDER — SODIUM CHLORIDE 0.9 % IV BOLUS (SEPSIS)
500.0000 mL | Freq: Once | INTRAVENOUS | Status: AC
  Administered 2017-06-16: 500 mL via INTRAVENOUS

## 2017-06-16 MED ORDER — SODIUM CHLORIDE 0.9 % IV SOLN
1000.0000 mg | Freq: Once | INTRAVENOUS | Status: AC
  Administered 2017-06-16: 1000 mg via INTRAVENOUS
  Filled 2017-06-16: qty 20

## 2017-06-16 NOTE — ED Triage Notes (Signed)
Per EMS, patient comes from [facility] senior living. Patient didn't show up to breakfast and they found him alseep in his room and could not wake him up. When fire arrived, he was only responsive to pain, when EMS arrived and transported him to the stretcher he woke up and was back at his baseline. Baseline is alert without talking. He prefers not to talk per sunrise staff. EMS also reported patient had a congested cough.

## 2017-06-16 NOTE — ED Notes (Signed)
[facility] called; awaiting transport

## 2017-06-16 NOTE — ED Notes (Signed)
Attempted to call report to [facility] senior living. Left a message.

## 2017-06-16 NOTE — ED Provider Notes (Signed)
[place] DEPT Provider Note   CSN: [id number] Arrival date & time: 06/16/17  0945     History   Chief Complaint Chief Complaint  Patient presents with  . Altered Mental Status    HPI Lucas Lee is a 71 y.o. male.  Patient was brought over because he was more lethargic than usual. Patient has a history of seizures. Patient has dementia and is a DO NOT RESUSCITATE   The history is provided by a relative.  Altered Mental Status   This is a recurrent problem. The current episode started 1 to 2 hours ago. The problem has been gradually improving. Associated symptoms include somnolence. Risk factors: Seizures. His past medical history is significant for seizures.    Past Medical History:  Diagnosis Date  . Dementia   . Hypertension   . Seizures ([place])   . Sleep apnea   . Stroke [facility])     There are no active problems to display for this patient.   Past Surgical History:  Procedure Laterality Date  . NOSE SURGERY         Home Medications    Prior to Admission medications   Medication Sig Start Date End Date Taking? Authorizing Provider  acetaminophen (TYLENOL) 325 MG tablet Take 650 mg by mouth every 6 (six) hours as needed for moderate pain.   Yes [provider]  aspirin 81 MG chewable tablet Chew 81 mg by mouth daily.   Yes [provider]  chlorhexidine (PERIDEX) 0.12 % solution Use as directed 15 mLs in the mouth or throat 2 (two) times daily.    Yes [provider]  doxycycline (VIBRAMYCIN) 100 MG capsule Take 100 mg by mouth 2 (two) times daily.  05/31/17  Yes [provider]  losartan (COZAAR) 50 MG tablet Take 50 mg by mouth daily with breakfast.    Yes [provider]  phenytoin (DILANTIN) 200 MG ER capsule Take 2 capsules (400 mg total) by mouth at bedtime. 06/01/17 07/01/17 Yes Jola Schmidt, MD  sertraline (ZOLOFT) 50 MG tablet Take 50 mg by mouth daily with breakfast.    Yes [provider]    simvastatin (ZOCOR) 10 MG tablet Take 10 mg by mouth at bedtime.    Yes [provider]  traZODone (DESYREL) 50 MG tablet Take 50 mg by mouth at bedtime.   Yes [provider]  azithromycin (ZITHROMAX Z-PAK) 250 MG tablet Take 1 tablet (250 mg total) by mouth daily. 06/16/17   Milton Ferguson, MD    Family History History reviewed. No pertinent family history.  Social History Social History  Substance Use Topics  . Smoking status: Never Smoker  . Smokeless tobacco: Never Used  . Alcohol use No     Allergies   Hydromorphone and Quinolones   Review of Systems Review of Systems  Unable to perform ROS: Dementia     Physical Exam Updated Vital Signs BP 104/63   Pulse 79   Temp 98.1 F (36.7 C) (Oral)   Resp 17   Ht 6' (1.829 m)   Wt 96.6 kg (213 lb)   SpO2 93%   BMI 28.89 kg/m   Physical Exam  Constitutional: He appears well-developed.  HENT:  Head: Normocephalic.  Eyes: Conjunctivae and EOM are normal. No scleral icterus.  Neck: Neck supple. No thyromegaly present.  Cardiovascular: Normal rate and regular rhythm.  Exam reveals no gallop and no friction rub.   No murmur heard. Pulmonary/Chest: No stridor. He has no wheezes.  He has no rales. He exhibits no tenderness.  Abdominal: He exhibits no distension. There is no tenderness. There is no rebound.  Musculoskeletal: Normal range of motion. He exhibits no edema.  Lymphadenopathy:    He has no cervical adenopathy.  Neurological: He exhibits normal muscle tone. Coordination normal.  Patient lethargic but arousable and oriented to person  Skin: No rash noted. No erythema.     ED Treatments / Results  Labs (all labs ordered are listed, but only abnormal results are displayed) Labs Reviewed  CBC WITH DIFFERENTIAL/PLATELET - Abnormal; Notable for the following:       Result Value   Neutro Abs 8.2 (*)    All other components within normal limits  COMPREHENSIVE METABOLIC PANEL - Abnormal; Notable  for the following:    Glucose, Bld 137 (*)    Creatinine, Ser 1.36 (*)    GFR calc non Af Amer 51 (*)    GFR calc Af Amer 59 (*)    All other components within normal limits  PHENYTOIN LEVEL, TOTAL - Abnormal; Notable for the following:    Phenytoin Lvl <2.5 (*)    All other components within normal limits  URINALYSIS, ROUTINE W REFLEX MICROSCOPIC  LACTIC ACID, PLASMA    EKG  EKG Interpretation None       Radiology Dg Chest 2 View  Result Date: 06/16/2017 CLINICAL DATA:  Cough for several days.  Decreased mental status. EXAM: CHEST  2 VIEW COMPARISON:  02/11/2017 FINDINGS: The heart size within normal limits. Stable tortuosity of thoracic aorta. Low lung volumes again noted. Both lungs are clear. The visualized skeletal structures are unremarkable. IMPRESSION: Low lung volumes.  No active disease. Electronically Signed   By: Earle Gell M.D.   On: 06/16/2017 11:00   Ct Head Wo Contrast  Result Date: 06/16/2017 CLINICAL DATA:  Altered mental status. EXAM: CT HEAD WITHOUT CONTRAST TECHNIQUE: Contiguous axial images were obtained from the base of the skull through the vertex without intravenous contrast. COMPARISON:  CT head dated June 01, 2017. FINDINGS: Brain: No evidence of acute infarction, hemorrhage, hydrocephalus, extra-axial collection or mass lesion/mass effect. Unchanged left temporo-occipital encephalomalacia. Periventricular white matter and corona radiata hypodensities favor chronic ischemic microvascular white matter disease. Age-related cerebral atrophy with compensatory dilatation of the ventricles, unchanged. Vascular: No hyperdense vessel or unexpected calcification. Skull: Normal. Negative for fracture or focal lesion. Sinuses/Orbits: The bilateral paranasal sinuses and mastoid air cells are clear. The orbits are unremarkable. Other: None. IMPRESSION: 1.  No acute intracranial abnormality. 2. Unchanged left temporo-occipital encephalomalacia. Electronically Signed   By:  Titus Dubin M.D.   On: 06/16/2017 10:56    Procedures Procedures (including critical care time)  Medications Ordered in ED Medications  sodium chloride 0.9 % bolus 500 mL (0 mLs Intravenous Stopped 06/16/17 1330)  phenytoin (DILANTIN) 1,000 mg in sodium chloride 0.9 % 250 mL IVPB (0 mg Intravenous Stopped 06/16/17 1322)  azithromycin (ZITHROMAX) 500 mg in dextrose 5 % 250 mL IVPB (0 mg Intravenous Stopped 06/16/17 1446)     Initial Impression / Assessment and Plan / ED Course  I have reviewed the triage vital signs and the nursing notes.  Pertinent labs & imaging results that were available during my care of the patient were reviewed by me and considered in my medical decision making (see chart for details).    Patient had a seizure for just a few seconds in the ER seen only by his wife. Suspect somnolence is related to seizures. Patient  had a low Dilantin level and has had that replaced. He has also had a persistent cough for over a week and he will be treated with some Zithromax for that. Wife is the power of attorney and she is going to speak with the patient's physician about possibly stopping any type of antibiotic therapy.   Final Clinical Impressions(s) / ED Diagnoses   Final diagnoses:  Seizure ([place])    New Prescriptions New Prescriptions   AZITHROMYCIN (ZITHROMAX Z-PAK) 250 MG TABLET    Take 1 tablet (250 mg total) by mouth daily.     Milton Ferguson, MD 06/16/17 [phone]

## 2017-06-16 NOTE — ED Notes (Signed)
Seizure pads in place

## 2017-06-16 NOTE — ED Notes (Signed)
Patient transported to X-ray 

## 2017-06-16 NOTE — ED Notes (Signed)
Pt's family reports pt had a seizure. When RN went to bedside pt was at baseline with RN. No shaking noted.  MD made aware that family wishes to speak with him.

## 2017-06-16 NOTE — ED Notes (Signed)
ED Provider at bedside. 

## 2017-06-16 NOTE — Discharge Instructions (Signed)
Follow up with your md this week. °

## 2017-06-16 NOTE — ED Notes (Signed)
Bed: [facility] Expected date:  Expected time:  Means of arrival:  Comments: 

## 2017-06-17 DIAGNOSIS — R2681 Unsteadiness on feet: Secondary | ICD-10-CM | POA: Diagnosis not present

## 2017-06-18 DIAGNOSIS — R2681 Unsteadiness on feet: Secondary | ICD-10-CM | POA: Diagnosis not present

## 2017-06-19 DIAGNOSIS — R2681 Unsteadiness on feet: Secondary | ICD-10-CM | POA: Diagnosis not present

## 2017-06-24 DIAGNOSIS — R2681 Unsteadiness on feet: Secondary | ICD-10-CM | POA: Diagnosis not present

## 2017-06-25 DIAGNOSIS — F325 Major depressive disorder, single episode, in full remission: Secondary | ICD-10-CM | POA: Diagnosis not present

## 2017-06-25 DIAGNOSIS — E7800 Pure hypercholesterolemia, unspecified: Secondary | ICD-10-CM | POA: Diagnosis not present

## 2017-06-25 DIAGNOSIS — I10 Essential (primary) hypertension: Secondary | ICD-10-CM | POA: Diagnosis not present

## 2017-06-25 DIAGNOSIS — R2681 Unsteadiness on feet: Secondary | ICD-10-CM | POA: Diagnosis not present

## 2017-06-25 DIAGNOSIS — G4700 Insomnia, unspecified: Secondary | ICD-10-CM | POA: Diagnosis not present

## 2017-06-25 DIAGNOSIS — G3109 Other frontotemporal dementia: Secondary | ICD-10-CM | POA: Diagnosis not present

## 2017-06-27 DIAGNOSIS — E785 Hyperlipidemia, unspecified: Secondary | ICD-10-CM | POA: Diagnosis not present

## 2017-06-27 DIAGNOSIS — I679 Cerebrovascular disease, unspecified: Secondary | ICD-10-CM | POA: Diagnosis not present

## 2017-06-27 DIAGNOSIS — K219 Gastro-esophageal reflux disease without esophagitis: Secondary | ICD-10-CM | POA: Diagnosis not present

## 2017-06-27 DIAGNOSIS — R569 Unspecified convulsions: Secondary | ICD-10-CM | POA: Diagnosis not present

## 2017-06-27 DIAGNOSIS — N183 Chronic kidney disease, stage 3 (moderate): Secondary | ICD-10-CM | POA: Diagnosis not present

## 2017-06-27 DIAGNOSIS — L719 Rosacea, unspecified: Secondary | ICD-10-CM | POA: Diagnosis not present

## 2017-06-27 DIAGNOSIS — F339 Major depressive disorder, recurrent, unspecified: Secondary | ICD-10-CM | POA: Diagnosis not present

## 2017-06-27 DIAGNOSIS — G3109 Other frontotemporal dementia: Secondary | ICD-10-CM | POA: Diagnosis not present

## 2017-06-27 DIAGNOSIS — I10 Essential (primary) hypertension: Secondary | ICD-10-CM | POA: Diagnosis not present

## 2017-06-27 DIAGNOSIS — R310 Gross hematuria: Secondary | ICD-10-CM | POA: Diagnosis not present

## 2017-06-28 DIAGNOSIS — E785 Hyperlipidemia, unspecified: Secondary | ICD-10-CM | POA: Diagnosis not present

## 2017-06-28 DIAGNOSIS — N183 Chronic kidney disease, stage 3 (moderate): Secondary | ICD-10-CM | POA: Diagnosis not present

## 2017-06-28 DIAGNOSIS — I679 Cerebrovascular disease, unspecified: Secondary | ICD-10-CM | POA: Diagnosis not present

## 2017-06-28 DIAGNOSIS — G3109 Other frontotemporal dementia: Secondary | ICD-10-CM | POA: Diagnosis not present

## 2017-06-28 DIAGNOSIS — I10 Essential (primary) hypertension: Secondary | ICD-10-CM | POA: Diagnosis not present

## 2017-06-28 DIAGNOSIS — R569 Unspecified convulsions: Secondary | ICD-10-CM | POA: Diagnosis not present

## 2017-06-30 DIAGNOSIS — I10 Essential (primary) hypertension: Secondary | ICD-10-CM | POA: Diagnosis not present

## 2017-06-30 DIAGNOSIS — N183 Chronic kidney disease, stage 3 (moderate): Secondary | ICD-10-CM | POA: Diagnosis not present

## 2017-06-30 DIAGNOSIS — E785 Hyperlipidemia, unspecified: Secondary | ICD-10-CM | POA: Diagnosis not present

## 2017-06-30 DIAGNOSIS — R569 Unspecified convulsions: Secondary | ICD-10-CM | POA: Diagnosis not present

## 2017-06-30 DIAGNOSIS — I679 Cerebrovascular disease, unspecified: Secondary | ICD-10-CM | POA: Diagnosis not present

## 2017-06-30 DIAGNOSIS — G3109 Other frontotemporal dementia: Secondary | ICD-10-CM | POA: Diagnosis not present

## 2017-07-03 DIAGNOSIS — G3109 Other frontotemporal dementia: Secondary | ICD-10-CM | POA: Diagnosis not present

## 2017-07-03 DIAGNOSIS — E785 Hyperlipidemia, unspecified: Secondary | ICD-10-CM | POA: Diagnosis not present

## 2017-07-03 DIAGNOSIS — I10 Essential (primary) hypertension: Secondary | ICD-10-CM | POA: Diagnosis not present

## 2017-07-03 DIAGNOSIS — N183 Chronic kidney disease, stage 3 (moderate): Secondary | ICD-10-CM | POA: Diagnosis not present

## 2017-07-03 DIAGNOSIS — I679 Cerebrovascular disease, unspecified: Secondary | ICD-10-CM | POA: Diagnosis not present

## 2017-07-03 DIAGNOSIS — R569 Unspecified convulsions: Secondary | ICD-10-CM | POA: Diagnosis not present

## 2017-07-12 DIAGNOSIS — 419620001 Death: Secondary | SNOMED CT | POA: Diagnosis not present

## 2017-07-12 DEATH — deceased

## 2018-07-21 IMAGING — CT CT HEAD CODE STROKE
4 series · 15 of 47 positions shown, 17 images · non-contrast
Comparison: CT 02/11/2017

CLINICAL DATA: Code stroke.  Left-sided weakness

EXAM:
CT HEAD WITHOUT CONTRAST
TECHNIQUE: Contiguous axial images were obtained from the base of the skull
through the vertex without intravenous contrast.

[Series 3: head wo · axial · 0.46mm/px · z∈[-160,-30]mm · 7 of 36 slices shown, 9 images]
[im 5/36  brain]
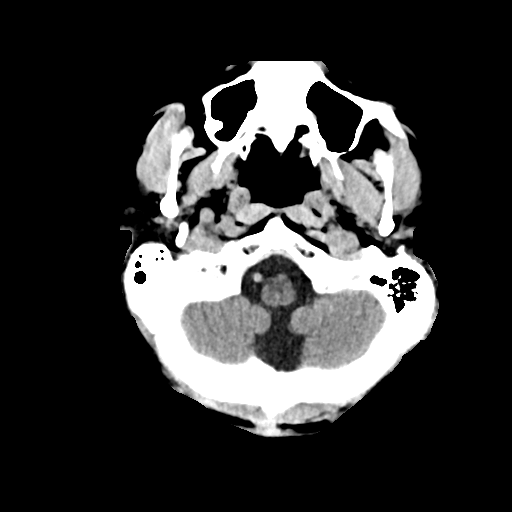
[im 5/36  bone]
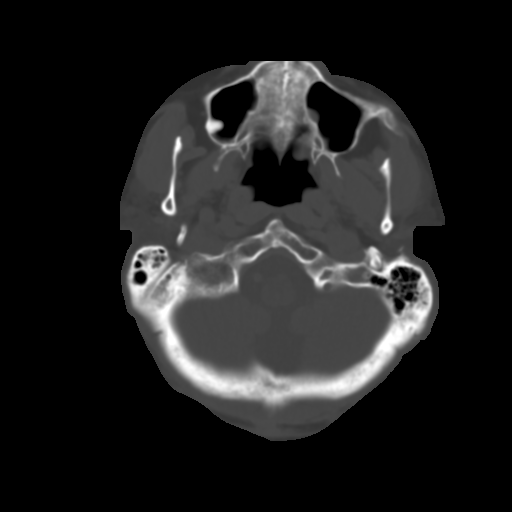
[im 9/36  brain]
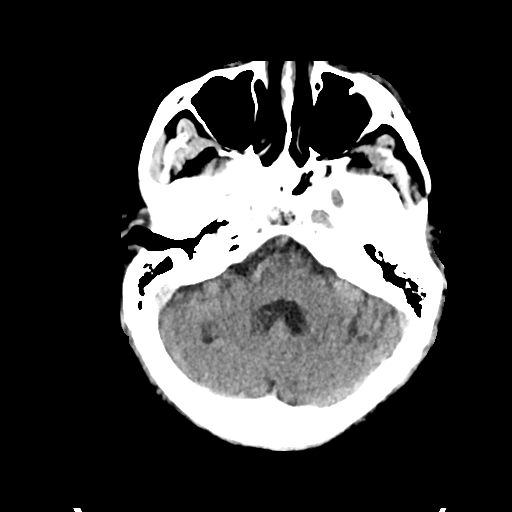
[im 14/36  brain]
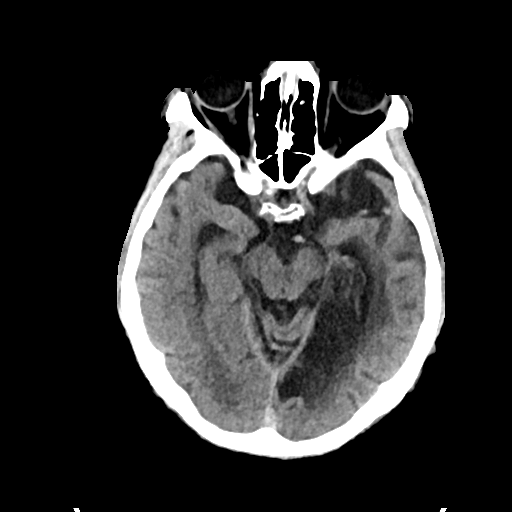
[im 18/36  brain]
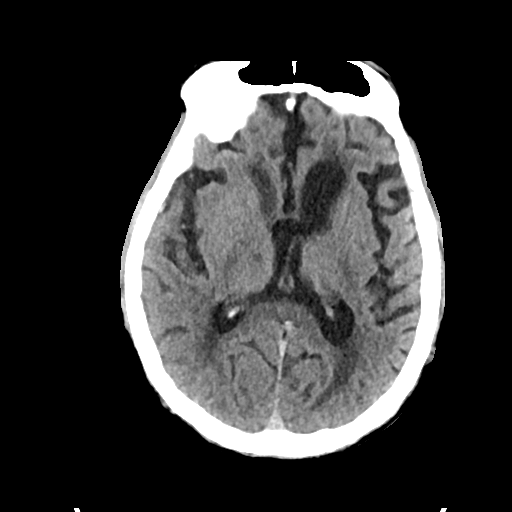
[im 22/36  brain]
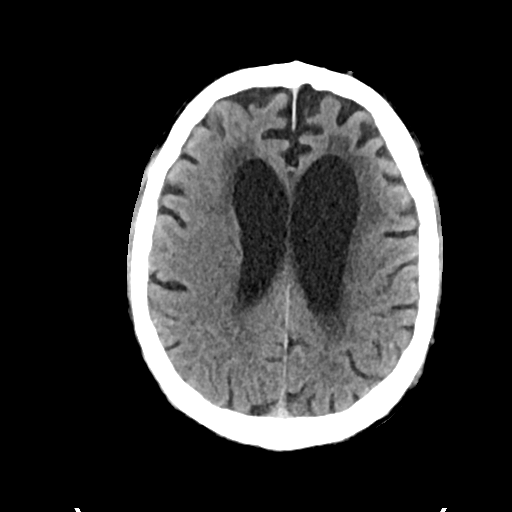
[im 22/36  bone]
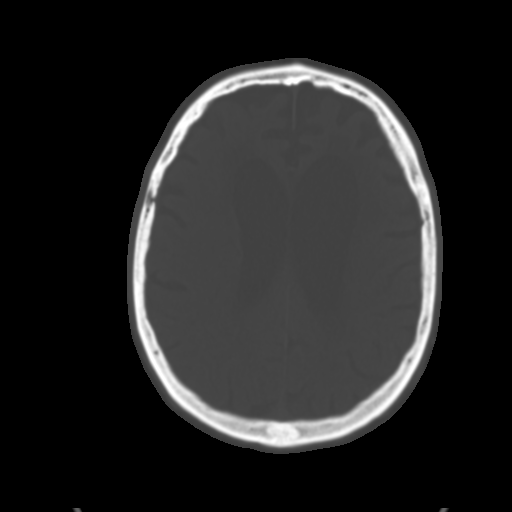
[im 27/36  brain]
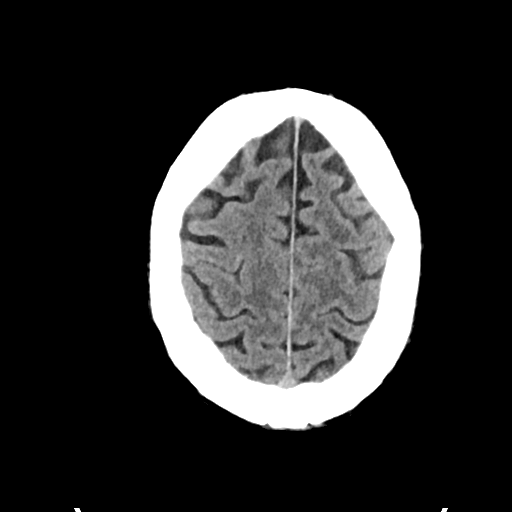
[im 31/36  brain]
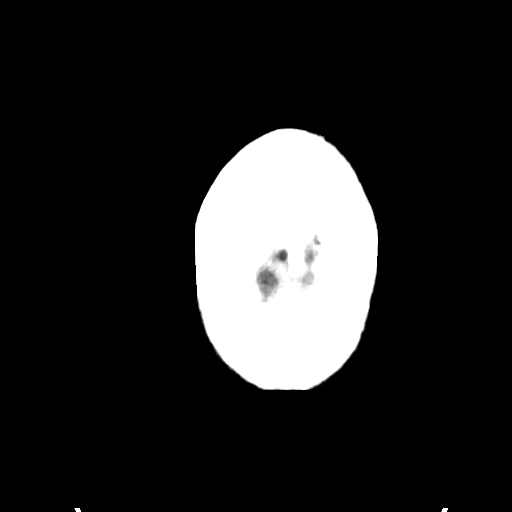

[Series 4: head bone · axial · 0.46mm/px · z∈[-164,-146]mm · 2 of 90 slices shown]
[im 9/90  bone]
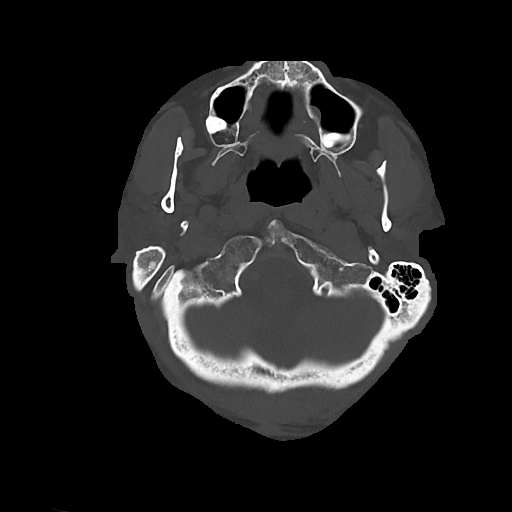
[im 18/90  bone]
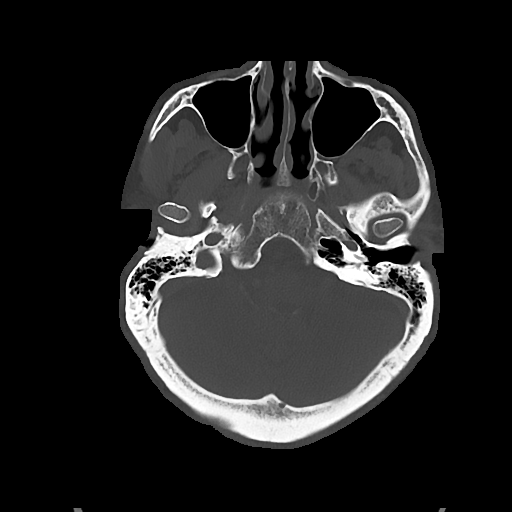

[Series 5: cor soft · coronal · 0.34mm/px · 3 of 78 slices shown]
[im 26/78  brain]
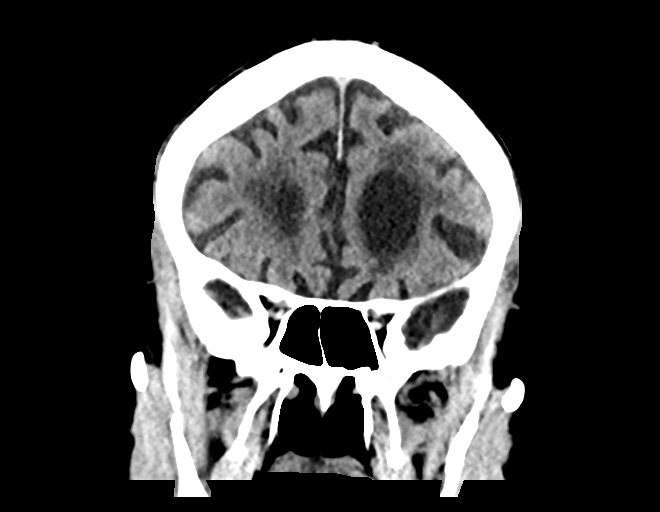
[im 35/78  brain]
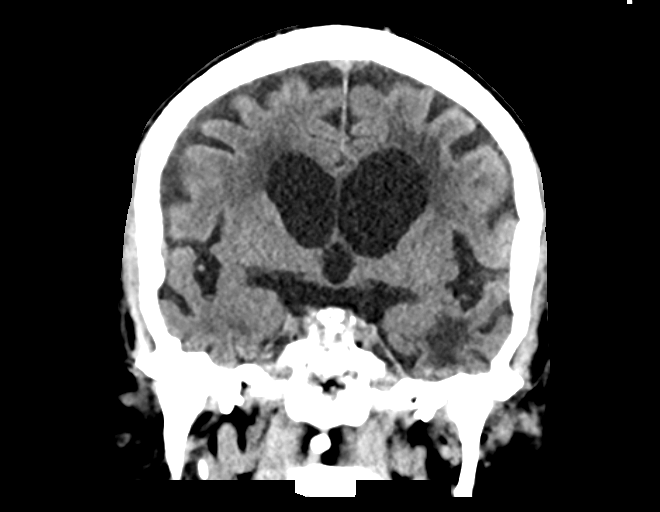
[im 43/78  brain]
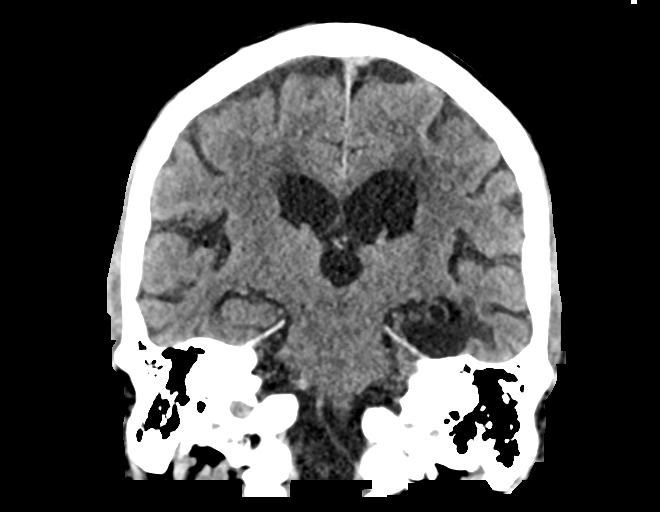

[Series 6: sag soft · sagittal · 0.35mm/px · 3 of 67 slices shown]
[im 23/67  brain]
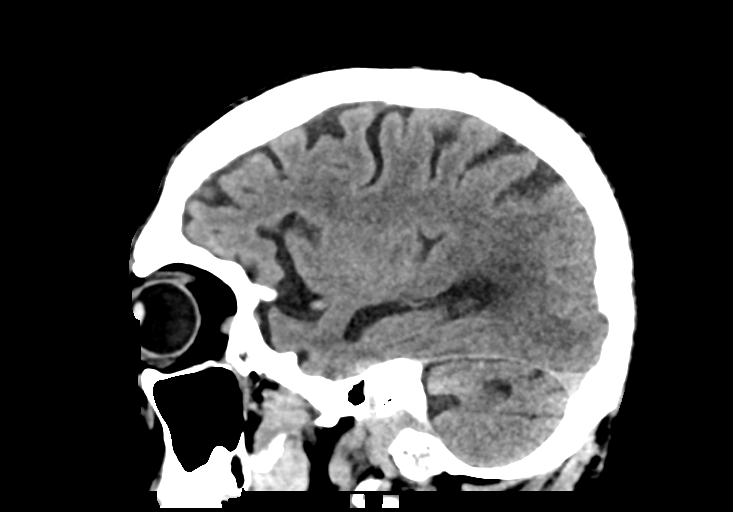
[im 34/67  brain]
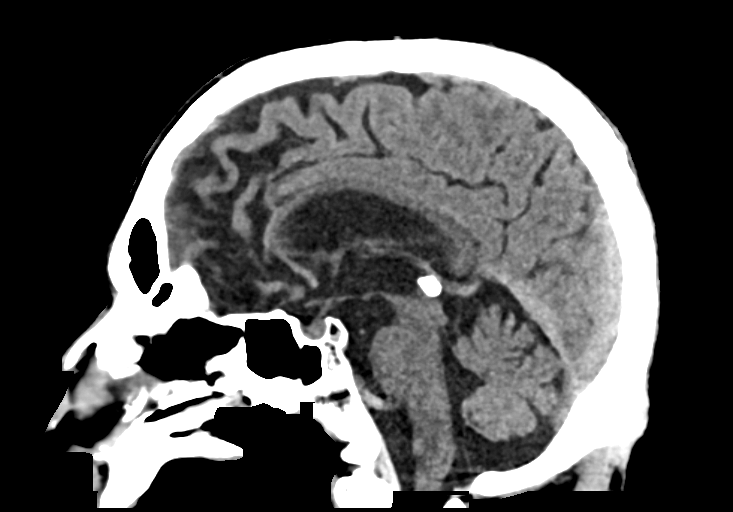
[im 45/67  brain]
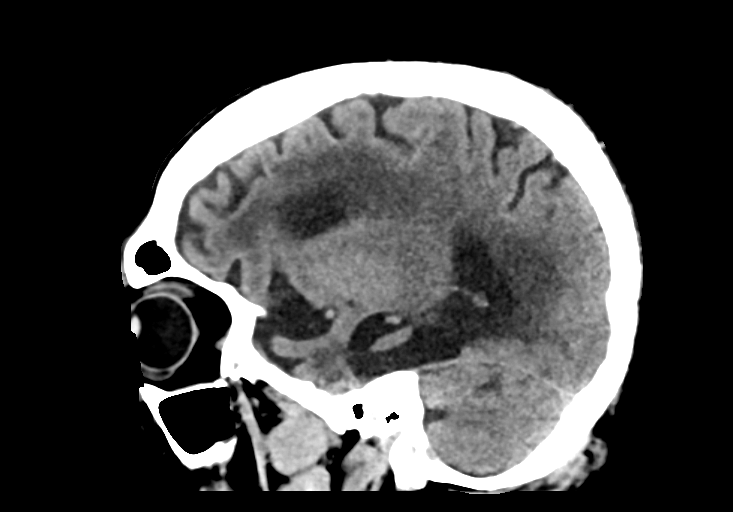

[15 of 47 positions shown; findings below may reference images not displayed]

FINDINGS: Brain: Chronic left posterior cerebral artery infarct involving the
hippocampus, temporal lobe, and occipital lobe unchanged from prior
studies. Chronic ischemia in the white matter. Small chronic
infarcts in the cerebellum bilaterally. Mild atrophy. Negative for
hemorrhage or mass

Vascular: Negative for hyperdense vessel

Skull: Negative

Sinuses/Orbits: Mild mucosal edema paranasal sinuses.  Normal orbit.

Other: None

ASPECTS ([place] Stroke Program Early CT Score)

- Ganglionic level infarction (caudate, lentiform nuclei, internal
capsule, insula, M1-M3 cortex): 7

- Supraganglionic infarction (M4-M6 cortex): 3

Total score (0-10 with 10 being normal): 10
IMPRESSION: 1. No acute intracranial abnormality.
2. ASPECTS is 10

Chronic left PCA infarct. Chronic microvascular ischemic change in
the white matter.

These results were called by telephone at the time of interpretation
on 06/01/2017 at [DATE] to Dr. Facer, who verbally
acknowledged these results.
